# Patient Record
Sex: Female | Born: 1970 | Race: White | Hispanic: No | Marital: Married | Smoking: Never smoker
Health system: Southern US, Community
[De-identification: ages and names within clinical notes are randomized; demographics above are authoritative.]

## PROBLEM LIST (undated history)

## (undated) DIAGNOSIS — I10 Essential (primary) hypertension: Secondary | ICD-10-CM

## (undated) HISTORY — PX: CHOLECYSTECTOMY: SHX55

## (undated) HISTORY — PX: TONSILLECTOMY: SUR1361

---

## 2010-09-22 DIAGNOSIS — M352 Behcet's disease: Secondary | ICD-10-CM | POA: Insufficient documentation

## 2015-06-24 ENCOUNTER — Ambulatory Visit: Payer: Self-pay | Admitting: Physician Assistant

## 2015-06-24 ENCOUNTER — Encounter: Payer: Self-pay | Admitting: Physician Assistant

## 2015-06-24 VITALS — BP 130/70 | HR 107 | Temp 98.4°F

## 2015-06-24 DIAGNOSIS — M62838 Other muscle spasm: Secondary | ICD-10-CM

## 2015-06-24 MED ORDER — METHYLPREDNISOLONE 4 MG PO TBPK: ORAL_TABLET | ORAL | Status: DC

## 2015-06-24 MED ORDER — CYCLOBENZAPRINE HCL 10 MG PO TABS: 10.0000 mg | ORAL_TABLET | Freq: Three times a day (TID) | ORAL | Status: DC | PRN

## 2015-06-24 NOTE — Progress Notes (Signed)
S:  C/o low back pain for 1 day, woke up Sunday with a "crick" in her neck, couldn't turn her head from side to side;  no known injury, pain is worse with movement, low back pain increased with bending over, denies numbness, tingling, or changes in bowel/urinary habits,  Using otc meds without relief, went to massage person at mall and had 10min neck massage, told her she needed to come back Remainder ros neg  O:  Vitals wnl, nad, lungs c t a, cv rrr, spine nontender, muscles in  Left shoulder and left lower back spasmed , decreased rom with bending forward, able to twist,  Neg slr, pt walks without difficulty, no foot drop noted, n/v intact  A: acute back pain, muscle spasms  P: use wet heat followed by ice, stretches, return to clinic if not better in 3 t 5 days, return earlier if worsening, rx meds: medrol dose pack, flexeril

## 2016-03-12 DIAGNOSIS — R7303 Prediabetes: Secondary | ICD-10-CM | POA: Insufficient documentation

## 2016-09-14 ENCOUNTER — Emergency Department: Payer: Managed Care, Other (non HMO)

## 2016-09-14 ENCOUNTER — Encounter: Payer: Self-pay | Admitting: Emergency Medicine

## 2016-09-14 ENCOUNTER — Inpatient Hospital Stay
Admission: EM | Admit: 2016-09-14 | Discharge: 2016-09-16 | DRG: 271 | Disposition: A | Discharge status: Home / Self Care | Payer: Managed Care, Other (non HMO) | Attending: Internal Medicine | Admitting: Internal Medicine

## 2016-09-14 DIAGNOSIS — R55 Syncope and collapse: Secondary | ICD-10-CM | POA: Diagnosis present

## 2016-09-14 DIAGNOSIS — I82412 Acute embolism and thrombosis of left femoral vein: Principal | ICD-10-CM | POA: Diagnosis present

## 2016-09-14 DIAGNOSIS — I82422 Acute embolism and thrombosis of left iliac vein: Secondary | ICD-10-CM | POA: Diagnosis present

## 2016-09-14 DIAGNOSIS — R634 Abnormal weight loss: Secondary | ICD-10-CM | POA: Diagnosis present

## 2016-09-14 DIAGNOSIS — R7303 Prediabetes: Secondary | ICD-10-CM | POA: Diagnosis present

## 2016-09-14 DIAGNOSIS — I10 Essential (primary) hypertension: Secondary | ICD-10-CM | POA: Diagnosis present

## 2016-09-14 DIAGNOSIS — I871 Compression of vein: Secondary | ICD-10-CM | POA: Diagnosis present

## 2016-09-14 DIAGNOSIS — I82402 Acute embolism and thrombosis of unspecified deep veins of left lower extremity: Secondary | ICD-10-CM | POA: Diagnosis not present

## 2016-09-14 DIAGNOSIS — R600 Localized edema: Secondary | ICD-10-CM | POA: Diagnosis not present

## 2016-09-14 DIAGNOSIS — Z79899 Other long term (current) drug therapy: Secondary | ICD-10-CM | POA: Diagnosis not present

## 2016-09-14 DIAGNOSIS — I82409 Acute embolism and thrombosis of unspecified deep veins of unspecified lower extremity: Secondary | ICD-10-CM | POA: Diagnosis present

## 2016-09-14 HISTORY — DX: Essential (primary) hypertension: I10

## 2016-09-14 LAB — URINALYSIS, COMPLETE (UACMP) WITH MICROSCOPIC
BACTERIA UA: NONE SEEN
Bilirubin Urine: NEGATIVE
Glucose, UA: >=500 mg/dL — AB
HGB URINE DIPSTICK: NEGATIVE
Ketones, ur: 5 mg/dL — AB
Leukocytes, UA: NEGATIVE
NITRITE: NEGATIVE
PROTEIN: 30 mg/dL — AB
Specific Gravity, Urine: 1.029 (ref 1.005–1.030)
pH: 5 (ref 5.0–8.0)

## 2016-09-14 LAB — HEPARIN LEVEL (UNFRACTIONATED): Heparin Unfractionated: 0.53 IU/mL (ref 0.30–0.70)

## 2016-09-14 LAB — BASIC METABOLIC PANEL
ANION GAP: 6 (ref 5–15)
BUN: 12 mg/dL (ref 6–20)
CHLORIDE: 102 mmol/L (ref 101–111)
CO2: 24 mmol/L (ref 22–32)
Calcium: 8.8 mg/dL — ABNORMAL LOW (ref 8.9–10.3)
Creatinine, Ser: 0.8 mg/dL (ref 0.44–1.00)
GFR calc Af Amer: >60 mL/min (ref >60)
GFR calc non Af Amer: >60 mL/min (ref >60)
GLUCOSE: 184 mg/dL — AB (ref 65–99)
POTASSIUM: 4 mmol/L (ref 3.5–5.1)
Sodium: 132 mmol/L — ABNORMAL LOW (ref 135–145)

## 2016-09-14 LAB — CBC
HEMATOCRIT: 37 % (ref 35.0–47.0)
HEMOGLOBIN: 12.8 g/dL (ref 12.0–16.0)
MCH: 32.3 pg (ref 26.0–34.0)
MCHC: 34.6 g/dL (ref 32.0–36.0)
MCV: 93.5 fL (ref 80.0–100.0)
Platelets: 349 10*3/uL (ref 150–440)
RBC: 3.95 MIL/uL (ref 3.80–5.20)
RDW: 12.5 % (ref 11.5–14.5)
WBC: 8.9 10*3/uL (ref 3.6–11.0)

## 2016-09-14 LAB — GLUCOSE, CAPILLARY: Glucose-Capillary: 172 mg/dL — ABNORMAL HIGH (ref 65–99)

## 2016-09-14 LAB — PROTIME-INR
INR: 1.05
PROTHROMBIN TIME: 13.6 s (ref 11.4–15.2)

## 2016-09-14 LAB — APTT: APTT: 26 s (ref 24–36)

## 2016-09-14 MED ORDER — HEPARIN BOLUS VIA INFUSION
4700.0000 [IU] | Freq: Once | INTRAVENOUS | Status: AC
  Administered 2016-09-14: 4700 [IU] via INTRAVENOUS
  Filled 2016-09-14: qty 4700

## 2016-09-14 MED ORDER — SODIUM CHLORIDE 0.9 % IV SOLN
INTRAVENOUS | Status: DC
  Administered 2016-09-14 – 2016-09-16 (×3): via INTRAVENOUS

## 2016-09-14 MED ORDER — ONDANSETRON HCL 4 MG/2ML IJ SOLN
4.0000 mg | Freq: Four times a day (QID) | INTRAMUSCULAR | Status: DC | PRN
  Administered 2016-09-15: 4 mg via INTRAVENOUS

## 2016-09-14 MED ORDER — HEPARIN (PORCINE) IN NACL 100-0.45 UNIT/ML-% IJ SOLN
1150.0000 [IU]/h | INTRAMUSCULAR | Status: DC
  Administered 2016-09-14 – 2016-09-15 (×2): 1150 [IU]/h via INTRAVENOUS
  Filled 2016-09-14 (×2): qty 250

## 2016-09-14 MED ORDER — ACETAMINOPHEN 325 MG PO TABS
650.0000 mg | ORAL_TABLET | Freq: Four times a day (QID) | ORAL | Status: DC | PRN
  Administered 2016-09-15 – 2016-09-16 (×2): 650 mg via ORAL
  Filled 2016-09-14 (×2): qty 2

## 2016-09-14 MED ORDER — ACETAMINOPHEN 650 MG RE SUPP
650.0000 mg | Freq: Four times a day (QID) | RECTAL | Status: DC | PRN
  Filled 2016-09-14: qty 1

## 2016-09-14 NOTE — ED Notes (Signed)
Admitting MD at bedside.

## 2016-09-14 NOTE — ED Provider Notes (Signed)
[facility] Emergency Department Provider Note  ____________________________________________  Time seen: Approximately 3:48 PM  I have reviewed the triage vital signs and the nursing notes.   HISTORY  Chief Complaint Loss of Consciousness    HPI Stacy Juarez is a 46 y.o. 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 She sat down and her bossesoffice where she then passed out for a few minutes. When she lay on the floor and people lifted her legs up did feel better.No fevers chills sweats chest pain shortness of breath belly pain or back pain. No headache numbness tingling weakness or vision changes. She reports that she feels essentially fine.   She has had bilateral hip pain for the past 2 days which started after she went running over the weekend both Saturday and Sunday. No trauma.also reports the left leg seems more swollen and painful.     History reviewed. No pertinent past medical history. pRe-diabetes Hypertension  There are no active problems to display for this patient.    Past Surgical History:  Procedure Laterality Date  . CHOLECYSTECTOMY    . TONSILLECTOMY       Prior to Admission medications   Medication Sig Start Date End Date Taking? Authorizing Provider  cyclobenzaprine (FLEXERIL) 10 MG tablet Take 1 tablet (10 mg total) by mouth 3 (three) times daily as needed for muscle spasms. 06/24/15   Fisher, Roselyn Bering, PA-C  losartan (COZAAR) 25 MG tablet Take 25 mg by mouth daily.    [provider]  methylPREDNISolone (MEDROL DOSEPAK) 4 MG TBPK tablet Take 6 pills on day one then decrease by 1 pill each day 06/24/15   Faythe Ghee, PA-C     Allergies Patient has no known allergies.   No family history on file.  Social History Social History  Substance Use Topics  . Smoking status: Never Smoker  . Smokeless  tobacco: Never Used  . Alcohol use Not on file    Review of Systems  Constitutional:   No fever or chills.  ENT:   No sore throat. No rhinorrhea. Cardiovascular:   No chest pain or syncope. Respiratory:   No dyspnea or cough. Gastrointestinal:   Negative for abdominal pain, vomiting and diarrhea.  Musculoskeletal:  Positive left leg pain and swelling All other systems reviewed and are negative except as documented above in ROS and HPI.  ____________________________________________   PHYSICAL EXAM:  VITAL SIGNS: ED Triage Vitals  Enc Vitals Group     BP 09/14/16 1126 (!) 118/53     Pulse Rate 09/14/16 1126 88     Resp 09/14/16 1126 16     Temp 09/14/16 1126 98.2 F (36.8 C)     Temp Source 09/14/16 1126 Oral     SpO2 09/14/16 1126 100 %     Weight 09/14/16 1127 151 lb (68.5 kg)     Height 09/14/16 1127 5\' 5"  (1.651 m)     Head Circumference --      Peak Flow --      Pain Score --      Pain Loc --      Pain Edu? --      Excl. in GC? --     Vital signs reviewed, nursing assessments reviewed.   Constitutional:   Alert and oriented. Well appearing and in no distress. Eyes:   No scleral icterus.  EOMI.  No nystagmus. No conjunctival pallor. PERRL. ENT   Head:   Normocephalic and atraumatic.   Nose:   No congestion/rhinnorhea.    Mouth/Throat:   MMM, no pharyngeal erythema. No peritonsillar mass.    Neck:   No meningismus. Full ROM Hematological/Lymphatic/Immunilogical:   No cervical lymphadenopathy. Cardiovascular:   RRR. Symmetric bilateral radial and DP pulses.  No murmurs.  Respiratory:   Normal respiratory effort without tachypnea/retractions. Breath sounds are clear and equal bilaterally. No wheezes/rales/rhonchi. Gastrointestinal:   Soft and nontender. Non distended. There is no CVA tenderness.  No rebound, rigidity, or guarding. Genitourinary:   deferred Musculoskeletal:   Normal range of motion in all extremities. No joint effusions.  Diffuse mild  tenderness in the left lower extremity which has pronounced swelling compared to the right. Enlarged calf Circumference.no palpable cord Neurologic:   Normal speech and language.  Motor grossly intact. No gross focal neurologic deficits are appreciated.  Skin:    Skin is warm, dry and intact.Faint erythema of the left lower leg..  ____________________________________________    LABS (pertinent positives/negatives) (all labs ordered are listed, but only abnormal results are displayed) Labs Reviewed  GLUCOSE, CAPILLARY - Abnormal; Notable for the following:       Result Value   Glucose-Capillary 172 (*)    All other components within normal limits  BASIC METABOLIC PANEL - Abnormal; Notable for the following:    Sodium 132 (*)    Glucose, Bld 184 (*)    Calcium 8.8 (*)    All other components within normal limits  URINALYSIS, COMPLETE (UACMP) WITH MICROSCOPIC - Abnormal; Notable for the following:    Color, Urine YELLOW (*)    APPearance HAZY (*)    Glucose, UA >=500 (*)    Ketones, ur 5 (*)    Protein, ur 30 (*)    Squamous Epithelial / LPF 0-5 (*)    All other components within normal limits  CBC  CBG MONITORING, ED   ____________________________________________   EKG  Interpreted by me  Date: 09/14/2016  Rate: 90  Rhythm: normal sinus rhythm  QRS Axis: normal  Intervals: normal  ST/T Wave abnormalities: normal  Conduction Disutrbances: none  Narrative Interpretation: unremarkable      ____________________________________________    RADIOLOGY  US Venous Img Lower Unilateral Left  Result Date: 09/14/2016 CLINICAL DATA:  Left leg pain and swelling. Patient on birth control. EXAM: LEFT LOWER EXTREMITY VENOUS DOPPLER ULTRASOUND TECHNIQUE: Gray-scale sonography with graded compression, as well as color Doppler and duplex ultrasound were performed to evaluate the lower extremity deep venous systems from the level of the common femoral vein and including the common  femoral, femoral, profunda femoral, popliteal and calf veins including the posterior tibial, peroneal and gastrocnemius veins when visible. The superficial great saphenous vein was also interrogated. Spectral Doppler was utilized to evaluate flow at rest and with distal augmentation maneuvers in the common femoral, femoral and popliteal veins. COMPARISON:  None. FINDINGS: Contralateral Common Femoral Vein: Respiratory phasicity is normal and symmetric with the symptomatic side. No evidence of thrombus. Normal compressibility. Common Femoral Vein: Occlusive thrombosis of the CFV. No compressibility or venous waveforms are demonstrated. Augmentation not performed. Saphenofemoral Junction: Nonocclusive thrombosis is noted with minimal color doppler flow noted within. Profunda Femoral Vein: Nonocclusive thrombosis is noted with some flow demonstrated . Femoral Vein: Nonocclusive thrombosis is noted in the mid to distal femoral vein with some color Doppler and venous waveforms detected in the proximal and distal femoral vein. Popliteal Vein: Nonocclusive  thrombosis with some flow demonstrated in the proximal as well as distal popliteal veins. Calf Veins: No evidence of thrombus. Normal compressibility and flow on color Doppler imaging. Superficial Great Saphenous Vein: No evidence of thrombus. Normal compressibility and flow on color Doppler imaging. Venous Reflux:  None. Other Findings: The left external iliac vein was difficult to assess and extension of thrombus cannot be entirely excluded within. IMPRESSION: 1. Occlusive DVT of the left common femoral vein with nonocclusive thrombus from GSV junction through popliteal vein. These results will be called to the ordering clinician or representative by the Radiologist Assistant, and communication documented in the PACS or zVision Dashboard. 2. There may also be thrombosis of the left external iliac vein but this is not well characterized given its location  sonographically. Electronically Signed   By: Tollie Eth M.D.   On: 09/14/2016 15:33    ____________________________________________   PROCEDURES Procedures  ____________________________________________   INITIAL IMPRESSION / ASSESSMENT AND PLAN / ED COURSE  Pertinent labs & imaging results that were available during my care of the patient were reviewed by me and considered in my medical decision making (see chart for details).    Clinical Course as of Sep 14 1633  Tue Sep 14, 2016  1424 P/w likely vagal syncope, but has exam concerning for lle dvt. Will get [place]. Otherwise well appearing, nad. Recent a1c 6.0.  [PS]  1545 [place] positive for proximal DVT. Will d/w vascular  [PS]  1600 [place] results and sx d/w Dr. Wyn Quaker, who recommends admission, anticoagulation, and would plan for thrombectomy/lysis tomorrow.   [PS]    Clinical Course User Index [PS] Sharman Cheek, MD     ----------------------------------------- 4:35 PM on 09/14/2016 -----------------------------------------  Discussed with hospitalist. Care plan discussed with patient who agrees. Start heparin. No signs or symptoms of pulmonary embolism at this time.  ____________________________________________   FINAL CLINICAL IMPRESSION(S) / ED DIAGNOSES  Final diagnoses:  Acute deep vein thrombosis (DVT) of femoral vein of left lower extremity (HCC)  Vasovagal syncope      New Prescriptions   No medications on file     Portions of this note were generated with dragon dictation software. Dictation errors may occur despite best attempts at proofreading.    Sharman Cheek, MD 09/14/16 [id number]

## 2016-09-14 NOTE — Consult Note (Signed)
ANTICOAGULATION CONSULT NOTE - Initial Consult  Pharmacy Consult for heparin drip Indication: DVT  No Known Allergies  Patient Measurements: Height: 5\' 5"  (165.1 cm) Weight: 151 lb (68.5 kg) IBW/kg (Calculated) : 57 Heparin Dosing Weight: 68.5kg  Vital Signs: Temp: 98.2 F (36.8 C) (08/28 1126) Temp Source: Oral (08/28 1126) BP: 109/56 (08/28 1638) Pulse Rate: 87 (08/28 1638)  Labs:  Recent Labs  09/14/16 1111  HGB 12.8  HCT 37.0  PLT 349  CREATININE 0.80    Estimated Creatinine Clearance: 84.5 mL/min (by C-G formula based on SCr of 0.8 mg/dL).   Medical History: History reviewed. No pertinent past medical history.  Medications:  Scheduled:  . heparin  4,700 Units Intravenous Once    Assessment: Pt is a 46 year old female who presents with dizziness, syncope but also complains on swelling and pain in leg. Found to have an occulsive DVT of left common femoral vein w/ nonocclusive thrombus of GSV w/ possible thrombus if left external iliac vein. Pt is not on any home anticoagulation as seen on med rec/ care everywhere. Baseline CBC WNL. Add on INR and APTT ordered. Vascular to see for possible thrombectomy/lysis tomorrow.  Goal of Therapy:  Heparin level 0.3-0.7 units/ml Monitor platelets by anticoagulation protocol: Yes   Plan:  Give 4700 units bolus x 1 Start heparin infusion at 1150 units/hr Check anti-Xa level in 6 hours and daily while on heparin Continue to monitor H&H and platelets  Jovonni Borquez D Keeyon Privitera, Pharm.D, BCPS Clinical Pharmacist  09/14/2016,4:41 PM

## 2016-09-14 NOTE — ED Triage Notes (Signed)
C/O syncope.  Patient states while at work, felt hot and clammy.  Went into bosses office and sat down on chair, stated didn't feel well.  Patient became unresponsive while sitting in chair.  Here for evaluation.  Also c/o right leg numbness this morning and now right leg "soreness".

## 2016-09-14 NOTE — ED Notes (Signed)
Pt to bathroom . Will start IV when returns.

## 2016-09-14 NOTE — H&P (Signed)
[facility] [place] [facility]   PATIENT NAME: Stacy Juarez    MR#:  [id number]  DATE OF BIRTH:  12-Jul-1970  DATE OF ADMISSION:  09/14/2016  PRIMARY CARE PHYSICIAN: Drue Stager, MD   REQUESTING/REFERRING PHYSICIAN: Dr Herma Carson  CHIEF COMPLAINT:   Chief Complaint  Patient presents with  . Loss of Consciousness    HISTORY OF PRESENT ILLNESS:  Stacy Juarez  is a 46 y.o. female presents after a passout episode. She complains of some numbness in her left leg. She stated her muscles were tight in her upper thigh going down. She got hot and clammy and nauseous. She had a questionable passout episode where she was not responding quickly. She was at work at the time and was sent into the ER. When her legs were placed up in the air and she was lied back down she felt a lot better. She's been having a lot of stress with her mother and she's not been sleeping well lately. She's had some weight loss over the past week and been tired over the past week. She does take birth control pills. She was found to have a DVT in the left lower extremity.  PAST MEDICAL HISTORY:   Past Medical History:  Diagnosis Date  . Hypertension     PAST SURGICAL HISTORY:   Past Surgical History:  Procedure Laterality Date  . CHOLECYSTECTOMY    . TONSILLECTOMY      SOCIAL HISTORY:   Social History  Substance Use Topics  . Smoking status: Never Smoker  . Smokeless tobacco: Never Used  . Alcohol use Not on file    FAMILY HISTORY:   Family History  Problem Relation Age of Onset  . CAD Mother   . Lymphoma Father     DRUG ALLERGIES:  No Known Allergies  REVIEW OF SYSTEMS:  CONSTITUTIONAL: No fever, positive for weight loss. Positive for fatigue.  EYES: No blurred or double vision.  EARS, NOSE, AND THROAT: No tinnitus or ear pain. No sore throat RESPIRATORY: No cough, shortness of breath, wheezing or hemoptysis.  CARDIOVASCULAR: No chest pain,  orthopnea, edema.  GASTROINTESTINAL: positive for nausea. novomiting, diarrhea or abdominal pain. No blood in bowel movements GENITOURINARY: No dysuria, hematuria.  ENDOCRINE: No polyuria, nocturia,  HEMATOLOGY: No anemia, easy bruising or bleeding SKIN: No rash or lesion. MUSCULOSKELETAL: No joint pain or arthritis.  Tightness in the left thigh NEUROLOGIC: No tingling, numbness, weakness. Possible syncope today PSYCHIATRY: No anxiety or depression.   MEDICATIONS AT HOME:   Prior to Admission medications   Medication Sig Start Date End Date Taking? Authorizing Provider  KRILL OIL PO Take 1 capsule by mouth daily.   Yes [provider]  levonorgestrel-ethinyl estradiol (NORDETTE) 0.15-30 MG-MCG tablet Take 1 tablet by mouth daily.   Yes [provider]  losartan (COZAAR) 25 MG tablet Take 25 mg by mouth daily.   Yes [provider]      VITAL SIGNS:  Blood pressure (!) 109/56, pulse 87, temperature 98.2 F (36.8 C), temperature source Oral, resp. rate 18, height 5\' 5"  (1.651 m), weight 68.5 kg (151 lb), last menstrual period 07/15/2016, SpO2 100 %.  PHYSICAL EXAMINATION:  GENERAL:  46 y.o.-year-old patient lying in the bed with no acute distress.  EYES: Pupils equal, round, reactive to light and accommodation. No scleral icterus. Extraocular muscles intact.  HEENT: Head atraumatic, normocephalic. Oropharynx and nasopharynx clear.  NECK:  Supple, no jugular venous distention. No thyroid enlargement, no  tenderness.  LUNGS: Normal breath sounds bilaterally, no wheezing, rales,rhonchi or crepitation. No use of accessory muscles of respiration.  CARDIOVASCULAR: S1, S2 normal. No murmurs, rubs, or gallops.  ABDOMEN: Soft, nontender, nondistended. Bowel sounds present. No organomegaly or mass.  EXTREMITIES: No pedal edema, cyanosis, or clubbing.  NEUROLOGIC: Cranial nerves II through XII are intact. Muscle strength 5/5 in all extremities. Sensation intact. Gait not  checked.  PSYCHIATRIC: The patient is alert and oriented x 3.  SKIN: No rash, lesion, or ulcer.   LABORATORY PANEL:   CBC  Recent Labs Lab 09/14/16 1111  WBC 8.9  HGB 12.8  HCT 37.0  PLT 349   ------------------------------------------------------------------------------------------------------------------  Chemistries   Recent Labs Lab 09/14/16 1111  NA 132*  K 4.0  CL 102  CO2 24  GLUCOSE 184*  BUN 12  CREATININE 0.80  CALCIUM 8.8*   ------------------------------------------------------------------------------------------------------------------   RADIOLOGY:  US Venous Img Lower Unilateral Left  Result Date: 09/14/2016 CLINICAL DATA:  Left leg pain and swelling. Patient on birth control. EXAM: LEFT LOWER EXTREMITY VENOUS DOPPLER ULTRASOUND TECHNIQUE: Gray-scale sonography with graded compression, as well as color Doppler and duplex ultrasound were performed to evaluate the lower extremity deep venous systems from the level of the common femoral vein and including the common femoral, femoral, profunda femoral, popliteal and calf veins including the posterior tibial, peroneal and gastrocnemius veins when visible. The superficial great saphenous vein was also interrogated. Spectral Doppler was utilized to evaluate flow at rest and with distal augmentation maneuvers in the common femoral, femoral and popliteal veins. COMPARISON:  None. FINDINGS: Contralateral Common Femoral Vein: Respiratory phasicity is normal and symmetric with the symptomatic side. No evidence of thrombus. Normal compressibility. Common Femoral Vein: Occlusive thrombosis of the CFV. No compressibility or venous waveforms are demonstrated. Augmentation not performed. Saphenofemoral Junction: Nonocclusive thrombosis is noted with minimal color doppler flow noted within. Profunda Femoral Vein: Nonocclusive thrombosis is noted with some flow demonstrated . Femoral Vein: Nonocclusive thrombosis is noted in the mid  to distal femoral vein with some color Doppler and venous waveforms detected in the proximal and distal femoral vein. Popliteal Vein: Nonocclusive thrombosis with some flow demonstrated in the proximal as well as distal popliteal veins. Calf Veins: No evidence of thrombus. Normal compressibility and flow on color Doppler imaging. Superficial Great Saphenous Vein: No evidence of thrombus. Normal compressibility and flow on color Doppler imaging. Venous Reflux:  None. Other Findings: The left external iliac vein was difficult to assess and extension of thrombus cannot be entirely excluded within. IMPRESSION: 1. Occlusive DVT of the left common femoral vein with nonocclusive thrombus from GSV junction through popliteal vein. These results will be called to the ordering clinician or representative by the Radiologist Assistant, and communication documented in the PACS or zVision Dashboard. 2. There may also be thrombosis of the left external iliac vein but this is not well characterized given its location sonographically. Electronically Signed   By: Tollie Eth M.D.   On: 09/14/2016 15:33    EKG:   Normal sinus rhythm 90 bpm  IMPRESSION AND PLAN:   1. Occlusive DVT left lower extremity. ER physician spoke with Dr. Wyn Quaker vascular surgery to do a thrombectomy and lysis tomorrow. Patient started on IV heparin by ER physician and I will continue this. Send off a hypercoagulable workup. Birth control pills is the likely etiology. I advised her to stop these birth control pills. 2. Vasovagal syncope. Give IV fluid hydration and hold losartan at this point.monitor overnight  on telemetry and get an echocardiogram. 3. Essential hypertension. Blood pressure on the lower side hold losartan at this point.  All the records are reviewed and case discussed with ED provider. Management plans discussed with the patient, family and they are in agreement.  CODE STATUS: full code  TOTAL TIME TAKING CARE OF THIS PATIENT: 50  minutes.    Alford Highland M.D on 09/14/2016 at 5:32 PM  Between 7am to 6pm - Pager - [phone]  After 6pm call admission pager [phone]  [facility] Office  [phone]  CC: Primary care physician; Drue Stager, MD

## 2016-09-14 NOTE — Progress Notes (Addendum)
ANTICOAGULATION CONSULT NOTE - Follow Up Consult  Pharmacy Consult for Heparin  Indication: DVT  No Known Allergies  Patient Measurements: Height: 5\' 5"  (165.1 cm) Weight: 151 lb (68.5 kg) IBW/kg (Calculated) : 57 Heparin Dosing Weight: 68.5 kg   Vital Signs: Temp: 98.3 F (36.8 C) (08/28 2123) Temp Source: Oral (08/28 2123) BP: 119/56 (08/28 2123) Pulse Rate: 79 (08/28 2123)  Labs:  Recent Labs  09/14/16 1111 09/14/16 1633 09/14/16 2257  HGB 12.8  --   --   HCT 37.0  --   --   PLT 349  --   --   APTT  --  26  --   LABPROT  --  13.6  --   INR  --  1.05  --   HEPARINUNFRC  --   --  0.53  CREATININE 0.80  --   --     Estimated Creatinine Clearance: 84.5 mL/min (by C-G formula based on SCr of 0.8 mg/dL).   Medications:  Scheduled:    Assessment: Pharmacy consulted to dose heparin in this 46 year old female with DVT.   Goal of Therapy:  Heparin level 0.3-0.7 units/ml Monitor platelets by anticoagulation protocol: Yes   Plan:  8/28:  HL @ 23:00 = 0.53.   Will continue this pt on current rate of 1150 units/hr and recheck HL in 6 hrs.   Rhoda Waldvogel D 09/14/2016,11:55 PM

## 2016-09-15 ENCOUNTER — Encounter
Admission: EM | Disposition: A | Discharge status: Home / Self Care | Payer: Self-pay | Source: Home / Self Care | Attending: Internal Medicine

## 2016-09-15 ENCOUNTER — Inpatient Hospital Stay
Admit: 2016-09-15 | Discharge: 2016-09-15 | Disposition: A | Discharge status: Home / Self Care | Payer: Managed Care, Other (non HMO) | Attending: Internal Medicine | Admitting: Internal Medicine

## 2016-09-15 ENCOUNTER — Inpatient Hospital Stay: Payer: Managed Care, Other (non HMO)

## 2016-09-15 DIAGNOSIS — I82402 Acute embolism and thrombosis of unspecified deep veins of left lower extremity: Secondary | ICD-10-CM

## 2016-09-15 DIAGNOSIS — R600 Localized edema: Secondary | ICD-10-CM

## 2016-09-15 DIAGNOSIS — I10 Essential (primary) hypertension: Secondary | ICD-10-CM

## 2016-09-15 HISTORY — PX: PERIPHERAL VASCULAR THROMBECTOMY: CATH118306

## 2016-09-15 LAB — CBC
HCT: 34.8 % — ABNORMAL LOW (ref 35.0–47.0)
HEMOGLOBIN: 11.9 g/dL — AB (ref 12.0–16.0)
MCH: 32 pg (ref 26.0–34.0)
MCHC: 34.1 g/dL (ref 32.0–36.0)
MCV: 93.8 fL (ref 80.0–100.0)
PLATELETS: 275 10*3/uL (ref 150–440)
RBC: 3.71 MIL/uL — AB (ref 3.80–5.20)
RDW: 12.4 % (ref 11.5–14.5)
WBC: 8.4 10*3/uL (ref 3.6–11.0)

## 2016-09-15 LAB — BASIC METABOLIC PANEL
ANION GAP: 4 — AB (ref 5–15)
BUN: 10 mg/dL (ref 6–20)
CHLORIDE: 108 mmol/L (ref 101–111)
CO2: 25 mmol/L (ref 22–32)
CREATININE: 0.77 mg/dL (ref 0.44–1.00)
Calcium: 8.4 mg/dL — ABNORMAL LOW (ref 8.9–10.3)
GFR calc non Af Amer: >60 mL/min (ref >60)
Glucose, Bld: 111 mg/dL — ABNORMAL HIGH (ref 65–99)
POTASSIUM: 3.9 mmol/L (ref 3.5–5.1)
SODIUM: 137 mmol/L (ref 135–145)

## 2016-09-15 LAB — HEPARIN LEVEL (UNFRACTIONATED): HEPARIN UNFRACTIONATED: 0.36 [IU]/mL (ref 0.30–0.70)

## 2016-09-15 LAB — PREGNANCY, URINE: Preg Test, Ur: NEGATIVE

## 2016-09-15 LAB — ANTITHROMBIN III: AntiThromb III Func: 88 % (ref 75–120)

## 2016-09-15 SURGERY — PERIPHERAL VASCULAR THROMBECTOMY
Anesthesia: Moderate Sedation | Laterality: Left

## 2016-09-15 MED ORDER — LIDOCAINE-EPINEPHRINE (PF) 2 %-1:200000 IJ SOLN
INTRAMUSCULAR | Status: AC
  Filled 2016-09-15: qty 20

## 2016-09-15 MED ORDER — CEFAZOLIN SODIUM-DEXTROSE 1-4 GM/50ML-% IV SOLN
1.0000 g | Freq: Once | INTRAVENOUS | Status: AC
  Administered 2016-09-15: 1 g via INTRAVENOUS

## 2016-09-15 MED ORDER — FENTANYL CITRATE (PF) 100 MCG/2ML IJ SOLN
INTRAMUSCULAR | Status: AC
  Filled 2016-09-15: qty 2

## 2016-09-15 MED ORDER — DEXTROSE 5 % IV SOLN
1.5000 g | INTRAVENOUS | Status: DC
  Filled 2016-09-15: qty 1.5

## 2016-09-15 MED ORDER — IOPAMIDOL (ISOVUE-300) INJECTION 61%
INTRAVENOUS | Status: DC | PRN
  Administered 2016-09-15: 55 mL via INTRAVENOUS

## 2016-09-15 MED ORDER — HEPARIN (PORCINE) IN NACL 2-0.9 UNIT/ML-% IJ SOLN
INTRAMUSCULAR | Status: AC
  Filled 2016-09-15: qty 1000

## 2016-09-15 MED ORDER — ONDANSETRON HCL 4 MG/2ML IJ SOLN
INTRAMUSCULAR | Status: AC
  Administered 2016-09-15: 4 mg
  Filled 2016-09-15: qty 2

## 2016-09-15 MED ORDER — ONDANSETRON HCL 4 MG/2ML IJ SOLN: 4.0000 mg | Freq: Once | INTRAMUSCULAR | Status: DC

## 2016-09-15 MED ORDER — SODIUM CHLORIDE 0.9 % IV SOLN: INTRAVENOUS | Status: DC

## 2016-09-15 MED ORDER — HEPARIN SODIUM (PORCINE) 1000 UNIT/ML IJ SOLN
INTRAMUSCULAR | Status: DC | PRN
  Administered 2016-09-15: 4000 [IU] via INTRAVENOUS

## 2016-09-15 MED ORDER — MIDAZOLAM HCL 5 MG/5ML IJ SOLN
INTRAMUSCULAR | Status: AC
  Filled 2016-09-15: qty 5

## 2016-09-15 MED ORDER — IOPAMIDOL (ISOVUE-370) INJECTION 76%
75.0000 mL | Freq: Once | INTRAVENOUS | Status: AC | PRN
  Administered 2016-09-15: 08:00:00 75 mL via INTRAVENOUS

## 2016-09-15 MED ORDER — ALTEPLASE 2 MG IJ SOLR
INTRAMUSCULAR | Status: DC | PRN
  Administered 2016-09-15: 10 mg

## 2016-09-15 MED ORDER — HEPARIN SODIUM (PORCINE) 1000 UNIT/ML IJ SOLN
INTRAMUSCULAR | Status: AC
  Filled 2016-09-15: qty 1

## 2016-09-15 MED ORDER — MIDAZOLAM HCL 2 MG/2ML IJ SOLN
INTRAMUSCULAR | Status: AC
  Filled 2016-09-15: qty 2

## 2016-09-15 MED ORDER — ALTEPLASE 2 MG IJ SOLR
INTRAMUSCULAR | Status: AC
  Filled 2016-09-15: qty 10

## 2016-09-15 MED ORDER — MIDAZOLAM HCL 2 MG/2ML IJ SOLN
INTRAMUSCULAR | Status: DC | PRN
Start: 2016-09-15 — End: 2016-09-15
  Administered 2016-09-15 (×5): 1 mg via INTRAVENOUS
  Administered 2016-09-15: 2 mg via INTRAVENOUS

## 2016-09-15 MED ORDER — OXYCODONE HCL 5 MG PO TABS
5.0000 mg | ORAL_TABLET | ORAL | Status: DC | PRN
  Administered 2016-09-15: 21:00:00 5 mg via ORAL
  Filled 2016-09-15: qty 1

## 2016-09-15 MED ORDER — FENTANYL CITRATE (PF) 100 MCG/2ML IJ SOLN
INTRAMUSCULAR | Status: DC | PRN
  Administered 2016-09-15: 25 ug via INTRAVENOUS
  Administered 2016-09-15: 50 ug via INTRAVENOUS
  Administered 2016-09-15: 25 ug via INTRAVENOUS
  Administered 2016-09-15: 50 ug via INTRAVENOUS
  Administered 2016-09-15: 25 ug via INTRAVENOUS
  Administered 2016-09-15 (×2): 50 ug via INTRAVENOUS
  Administered 2016-09-15: 25 ug via INTRAVENOUS

## 2016-09-15 SURGICAL SUPPLY — 18 items
BALLN ARMADA 14X80X80 (BALLOONS) ×3
BALLN ULTRVRSE 12X80X75 (BALLOONS) ×3
BALLOON ARMADA 14X80X80 (BALLOONS) ×1 IMPLANT
BALLOON ULTRVRSE 12X80X75 (BALLOONS) ×1 IMPLANT
CANISTER PENUMBRA MAX (MISCELLANEOUS) ×2 IMPLANT
CANNULA 5F STIFF (CANNULA) ×3 IMPLANT
CATH BEACON 5 .035 65 KMP TIP (CATHETERS) ×3 IMPLANT
CATH INDIGO 8 TORQ TIP 85CM (CATHETERS) ×3 IMPLANT
COVER PROBE U/S 5X48 (MISCELLANEOUS) ×6 IMPLANT
DEVICE PRESTO INFLATION (MISCELLANEOUS) ×3 IMPLANT
DEVICE SAFEGUARD 24CM (GAUZE/BANDAGES/DRESSINGS) ×3 IMPLANT
FILTER VC CELECT-FEMORAL (Filter) ×3 IMPLANT
PACK ANGIOGRAPHY (CUSTOM PROCEDURE TRAY) ×3 IMPLANT
SHEATH BRITE TIP 8FRX11 (SHEATH) ×3 IMPLANT
STENT LIFESTAR 14X80X80 (Permanent Stent) ×3 IMPLANT
TUBING ASPIRATION INDIGO (MISCELLANEOUS) ×3 IMPLANT
WIRE J 3MM .035X145CM (WIRE) ×3 IMPLANT
WIRE MAGIC TORQUE 260C (WIRE) ×3 IMPLANT

## 2016-09-15 NOTE — Progress Notes (Signed)
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 Continue to monitor

## 2016-09-15 NOTE — Op Note (Signed)
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 US guidance for vascular access to right femoral vein and left popliteal vein 2. Catheter placement into IVC from right femoral vein approach and left popliteal vein approach 3. IVC gram and LLE venogram 4. IVC filter placement 5.   Catheter directed thrombolysis with 10 mg of TPA to the left superficial femoral vein, common femoral vein, external iliac vein, and common iliac vein 6. Mechanical thrombectomy to the left superficial femoral vein, common femoral vein, external iliac vein, and common iliac vein with the penumbra cat 8 catheter 7. PTA of left superficial femoral vein and common femoral vein with 12 mm balloon 8. PTA of left external iliac vein and common iliac vein with 12 mm balloon 9.   Self-expanding stent placement to the left external and common iliac veins with 14 mm diameter by 8 cm length stent   SURGEON: Leotis Pain, MD  ASSISTANT(S): none  ANESTHESIA: local with moderate conscious sedation for 60 minutes using 7 mg of Versed and 300 mcg of Fentanyl  ESTIMATED BLOOD LOSS: 300 cc  FINDING(S): 1. extensive left lower extremity DVT throughout the left superficial femoral vein, common femoral vein, external iliac vein, and common iliac vein. The IVC was patent. 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  SPECIMEN(S): none  INDICATIONS:  Patient is a 46 y.o. female who presents with extensive left lower extremity DVT. Patient has marked leg swelling and pain. Venous intervention is performed to reduce the symtpoms and avoid long term postphlebitic symptoms.   DESCRIPTION: After obtaining full informed written consent, the patient was brought back to the vascular suite and placed supine upon the table.Moderate conscious sedation  was administered during a face to face encounter with the patient throughout the procedure with my supervision of the RN administering medicines and monitoring the patient's vital signs, pulse oximetry, telemetry and mental status throughout from the start of the procedure until the patient was taken to the recovery room. After obtaining adequate anesthesia, the patient was prepped and draped in the standard fashion. The right femoral vein was then accessed under US guidance and found to be widely patent. It was accessed without difficulty and a permanent image was recorded. I then placed the delivery sheath into the IVC. The IVC was patent and the renal veins were at the level of L1. The Cook Celect IVC filter was then deployed at the level of L2. The delivery sheath was then removed and dressings were placed in the right groin.  The patient was then placed into the prone position. The left popliteal vein was then accessed under direct ultrasound guidance without difficulty with a micropuncture needle and a permanent image was recorded. I then upsized to an 8Fr sheath over a J wire. 4000 units of heparin were then given. Imaging showed extensive DVT with minimal flow. A Kumpe catheter and Magic torque wire were then advanced into the CFV and images were performed. there was occlusion of the iliac vein system consistent with May Thurner syndrome. I was able to cross the thrombus and stenosis and advance into the IVC which was patent. I then used the Kumpe catheter and instilled 10 mg of tpa throughout the SFV, CFV, and iliac veins.  After this dwelled for 10 minutes, I used the penumbra cat 8 catheter and evacuated about 300 cc  of effluent with mechanical thrombectomy throughout the iliac veins, CFV, and SFV.multiple passes were made and large chunks of clot were removed. This had mild improvement in the common femoral vein and iliac venous system.The SFV was actually significantly improved up to  the most proximal portion near the common femoral vein. I then treated the SFV and CFV with 12 mm diameter angioplasty balloon to open a channel. This resulted in some resolution of the thrombus and improved flow. I then turned my attention to the iliac veins. The stenosis/occlusion and thrombus was treated with 2 inflations with the 12 mm diameter by 8 cm length angioplasty balloon. There was a very tight waist which improved at about 6-8 atm but there was immediate recoil and near occlusive residual stenosis. I elected to treat the residual iliac vein stenosis represented a May Thurner syndrome with a 14 mm diameter by 8 cm length self-expanding stent. This was deployed across the external iliac vein and common iliac vein taking care not to encroach upon the inferior vena cava.This was postdilated with a 14 mm balloon with only about a 20% residual stenosis and improved flow. There was still mild to moderate residual thrombus within the common femoral vein and proximal superficial femoral vein but this was markedly improved. I then elected to terminate the procedure. The sheath was removed and a dressing was placed. She was taken to the recovery room in stable condition having tolerated the procedure well.   COMPLICATIONS: None  CONDITION: Stable  Leotis Pain 09/15/2016 6:25 PM

## 2016-09-15 NOTE — Plan of Care (Signed)
Problem: Education: Goal: Knowledge of [facility] Education information/materials will improve Outcome: Progressing VSS, free of falls during shift.  Denies pain, reports "discomfort" in LLE.  Reported nausea post-ambulation to bathroom/in room.  Dr. Amado CoeGouru paged, PRN IV Zofran 4mg  q6h ordered.  Pt reported nausea resolving, declined IV Zofran.  Refused lab draw earlier in shift d/t concern re: low PO intake and amount of blood needed for draw.  This RN spoke w/ lab, 1700 and 2300 lab draws consolidated, requested fewest number of tubes drawn, pt compliant w/ 2300 lab draw.  No other complaints overnight.  Bed in low position, call bell within reach.  WCTM.

## 2016-09-15 NOTE — Progress Notes (Signed)
[facility] [place] [facility]   PATIENT NAME: Stacy Juarez    MR#:  [id number]  DATE OF BIRTH:  1970-01-30  SUBJECTIVE:  CHIEF COMPLAINT:   Chief Complaint  Patient presents with  . Loss of Consciousness   - on heparin drip for left leg DVT. Going for thrombolysis, thrombectomy and IVC filter placement today. -Still having pain on weight-bearing  REVIEW OF SYSTEMS:  Review of Systems  Constitutional: Negative for chills, fever and malaise/fatigue.  HENT: Negative for congestion, ear discharge, hearing loss and nosebleeds.   Eyes: Negative for blurred vision, double vision and photophobia.  Respiratory: Negative for cough, shortness of breath and wheezing.   Cardiovascular: Positive for leg swelling. Negative for chest pain and palpitations.  Gastrointestinal: Negative for abdominal pain, constipation, diarrhea, nausea and vomiting.  Genitourinary: Negative for dysuria and urgency.  Musculoskeletal: Positive for myalgias.  Neurological: Negative for dizziness, sensory change, speech change, focal weakness, seizures and headaches.  Psychiatric/Behavioral: Negative for depression.    DRUG ALLERGIES:  No Known Allergies  VITALS:  Blood pressure 120/64, pulse 76, temperature 98.3 F (36.8 C), temperature source Oral, resp. rate 16, height 5\' 5"  (1.651 m), weight 68.5 kg (151 lb), last menstrual period 07/15/2016, SpO2 98 %.  PHYSICAL EXAMINATION:  Physical Exam  GENERAL:  46 y.o.-year-old patient lying in the bed with no acute distress.  EYES: Pupils equal, round, reactive to light and accommodation. No scleral icterus. Extraocular muscles intact.  HEENT: Head atraumatic, normocephalic. Oropharynx and nasopharynx clear.  NECK:  Supple, no jugular venous distention. No thyroid enlargement, no tenderness.  LUNGS: Normal breath sounds bilaterally, no wheezing, rales,rhonchi or crepitation. No use of accessory muscles of respiration.  CARDIOVASCULAR:  S1, S2 normal. No murmurs, rubs, or gallops.  ABDOMEN: Soft, nontender, nondistended. Bowel sounds present. No organomegaly or mass.  EXTREMITIES: left leg is swollen, not erythematous but swollen all the way to the thigh. -No pedal edema, cyanosis, or clubbing.  NEUROLOGIC: Cranial nerves II through XII are intact. Muscle strength 5/5 in all extremities. Sensation intact. Gait not checked.  PSYCHIATRIC: The patient is alert and oriented x 3.  SKIN: No obvious rash, lesion, or ulcer.    LABORATORY PANEL:   CBC  Recent Labs Lab 09/15/16 0406  WBC 8.4  HGB 11.9*  HCT 34.8*  PLT 275   ------------------------------------------------------------------------------------------------------------------  Chemistries   Recent Labs Lab 09/15/16 0406  NA 137  K 3.9  CL 108  CO2 25  GLUCOSE 111*  BUN 10  CREATININE 0.77  CALCIUM 8.4*   ------------------------------------------------------------------------------------------------------------------  Cardiac Enzymes No results for input(s): TROPONINI in the last 168 hours. ------------------------------------------------------------------------------------------------------------------  RADIOLOGY:  Ct Angio Chest Pe W Or Wo Contrast  Result Date: 09/15/2016 CLINICAL DATA:  Syncope. Lower extremity DVT. Question pulmonary embolus. EXAM: CT ANGIOGRAPHY CHEST WITH CONTRAST TECHNIQUE: Multidetector CT imaging of the chest was performed using the standard protocol during bolus administration of intravenous contrast. Multiplanar CT image reconstructions and MIPs were obtained to evaluate the vascular anatomy. CONTRAST:  75 cc Isovue 370 IV COMPARISON:  None. FINDINGS: Cardiovascular: No filling defects in the pulmonary arteries to suggest pulmonary emboli. Heart is normal size. Aorta is normal caliber. Mediastinum/Nodes: No mediastinal, hilar, or axillary adenopathy. Trachea and esophagus are unremarkable. Lungs/Pleura: Lungs are clear. No  focal airspace opacities or suspicious nodules. No effusions. Upper Abdomen: Imaging into the upper abdomen shows no acute findings. Musculoskeletal: Chest wall soft tissues are unremarkable. No acute bony abnormality. Review of the  MIP images confirms the above findings. IMPRESSION: No evidence of pulmonary embolus. No acute cardiopulmonary disease Electronically Signed   By: Charlett Nose M.D.   On: 09/15/2016 08:57   US Venous Img Lower Unilateral Left  Result Date: 09/14/2016 CLINICAL DATA:  Left leg pain and swelling. Patient on birth control. EXAM: LEFT LOWER EXTREMITY VENOUS DOPPLER ULTRASOUND TECHNIQUE: Gray-scale sonography with graded compression, as well as color Doppler and duplex ultrasound were performed to evaluate the lower extremity deep venous systems from the level of the common femoral vein and including the common femoral, femoral, profunda femoral, popliteal and calf veins including the posterior tibial, peroneal and gastrocnemius veins when visible. The superficial great saphenous vein was also interrogated. Spectral Doppler was utilized to evaluate flow at rest and with distal augmentation maneuvers in the common femoral, femoral and popliteal veins. COMPARISON:  None. FINDINGS: Contralateral Common Femoral Vein: Respiratory phasicity is normal and symmetric with the symptomatic side. No evidence of thrombus. Normal compressibility. Common Femoral Vein: Occlusive thrombosis of the CFV. No compressibility or venous waveforms are demonstrated. Augmentation not performed. Saphenofemoral Junction: Nonocclusive thrombosis is noted with minimal color doppler flow noted within. Profunda Femoral Vein: Nonocclusive thrombosis is noted with some flow demonstrated . Femoral Vein: Nonocclusive thrombosis is noted in the mid to distal femoral vein with some color Doppler and venous waveforms detected in the proximal and distal femoral vein. Popliteal Vein: Nonocclusive thrombosis with some flow  demonstrated in the proximal as well as distal popliteal veins. Calf Veins: No evidence of thrombus. Normal compressibility and flow on color Doppler imaging. Superficial Great Saphenous Vein: No evidence of thrombus. Normal compressibility and flow on color Doppler imaging. Venous Reflux:  None. Other Findings: The left external iliac vein was difficult to assess and extension of thrombus cannot be entirely excluded within. IMPRESSION: 1. Occlusive DVT of the left common femoral vein with nonocclusive thrombus from GSV junction through popliteal vein. These results will be called to the ordering clinician or representative by the Radiologist Assistant, and communication documented in the PACS or zVision Dashboard. 2. There may also be thrombosis of the left external iliac vein but this is not well characterized given its location sonographically. Electronically Signed   By: Tollie Eth M.D.   On: 09/14/2016 15:33    EKG:   Orders placed or performed during the hospital encounter of 09/14/16  . EKG 12-Lead  . EKG 12-Lead  . ED EKG  . ED EKG    ASSESSMENT AND PLAN:   46 year old female with past medical history significant only for hypertension presents to hospital secondary to a syncopal episode and noted to have extensive left leg DVT.  #1 syncope-likely vasovagal from the pain -CT of the chest negative for any pulmonary embolism. Blood pressure has been stable. No further dizziness or syncopal episodes.  #2 extensive left leg DVT-extending likely into the iliac veins. -Likely predisposing factor could be heard taking birth control pills. No other family history of any blood clots. -Hypercoagulability workup sent out for. -Appreciate vascular consult. Currently on heparin drip -For thrombolysis, thrombectomy and IVC filter placement this afternoon. We'll start eliquis this evening.  #3 hypertension-stable -not on any home medications.  #4 DVT prophylaxis-patient on heparin  drip     All the records are reviewed and case discussed with Care Management/Social Workerr. Management plans discussed with the patient, family and they are in agreement.  CODE STATUS: full code  TOTAL TIME TAKING CARE OF THIS PATIENT: 37 minutes.  POSSIBLE D/C 1-2 days, DEPENDING ON CLINICAL CONDITION.   Glendi Mohiuddin M.D on 09/15/2016 at 1:04 PM  Between 7am to 6pm - Pager - [phone]  After 6pm go to [email or web address] - Social research officer, government  [facility]  Office  [phone]  CC: Primary care physician; Drue Stager, MD

## 2016-09-15 NOTE — Progress Notes (Signed)
Patient post procedure. Coban dressing to left leg for overnight. Tolerated procedure well. Vitals remained stable throughout entire procedure. Report called patient';s care nurse with plan reviewed. Plan for patient to have left leg elevated. May have head of bed elevated per Dr Driscilla Grammes orders. Plan for return to regular diet. Will also plan to restart heparin gtt at previous rate per Dr Driscilla Grammes request.

## 2016-09-15 NOTE — Progress Notes (Signed)
ANTICOAGULATION CONSULT NOTE - Follow Up Consult  Pharmacy Consult for Heparin  Indication: DVT  No Known Allergies  Patient Measurements: Height: 5\' 5"  (165.1 cm) Weight: 151 lb (68.5 kg) IBW/kg (Calculated) : 57 Heparin Dosing Weight: 68.5 kg   Vital Signs: Temp: 98.3 F (36.8 C) (08/29 0511) Temp Source: Oral (08/29 0511) BP: 125/55 (08/29 0511) Pulse Rate: 73 (08/29 0511)  Labs:  Recent Labs  09/14/16 1111 09/14/16 1633 09/14/16 2257 09/15/16 0406  HGB 12.8  --   --  11.9*  HCT 37.0  --   --  34.8*  PLT 349  --   --  275  APTT  --  26  --   --   LABPROT  --  13.6  --   --   INR  --  1.05  --   --   HEPARINUNFRC  --   --  0.53 0.36  CREATININE 0.80  --   --  0.77    Estimated Creatinine Clearance: 84.5 mL/min (by C-G formula based on SCr of 0.77 mg/dL).   Medications:  Scheduled:    Assessment: Pharmacy consulted to dose heparin in this 46 year old female with DVT.   Goal of Therapy:  Heparin level 0.3-0.7 units/ml Monitor platelets by anticoagulation protocol: Yes   Plan:  8/28:  HL @ 23:00 = 0.53.   Will continue this pt on current rate of 1150 units/hr and recheck HL in 6 hrs.   8/29: HL @ 0400 = 0.36.  Will continue this pt on current rate and recheck HL on 8/30 with AM labs.   Abel Hageman D 09/15/2016,5:15 AM

## 2016-09-15 NOTE — Consult Note (Signed)
[facility] Vascular Consult Note  MRN : [id number]  Stacy Juarez is a 45 y.o. (Jan 09, 1971) female who presents with chief complaint of  Chief Complaint  Patient presents with  . Loss of Consciousness  .  History of Present Illness: I am asked to see the patient by Dr. Scotty Court and Dr. Renae Gloss  regarding extensive left lower extremity DVT. The patient is reasonably healthy but had a recent extensive exercise session and has developed severe left leg pain and swelling. This is really only started in the last day or 2. The swelling is quite prominent but is better today with overnight elevation and heparin that was yesterday. She denies a previous history of DVT or superficial thrombophlebitis. No significant right leg symptoms. No chest pain but she does feel weak and somewhat short of breath. No fever or chills. A duplex showed extensive left lower extremity DVT all the way up to the common femoral vein with question of thrombus within the iliac vein. Our institution does not image iliac veins and general and I suspect the DVT progresses further up than they are visualizing  Current Facility-Administered Medications  Medication Dose Route Frequency Provider Last Rate Last Dose  . 0.9 %  sodium chloride infusion   Intravenous Continuous Alford Highland, MD 50 mL/hr at 09/14/16 2244    . acetaminophen (TYLENOL) tablet 650 mg  650 mg Oral Q6H PRN Alford Highland, MD       Or  . acetaminophen (TYLENOL) suppository 650 mg  650 mg Rectal Q6H PRN Wieting, Richard, MD      . heparin ADULT infusion 100 units/mL (25000 units/260mL sodium chloride 0.45%)  1,150 Units/hr Intravenous Continuous Malena Peer D, RPH 11.5 mL/hr at 09/15/16 1058 1,150 Units/hr at 09/15/16 1058  . ondansetron (ZOFRAN) injection 4 mg  4 mg Intravenous Q6H PRN Ramonita Lab, MD        Past Medical History:  Diagnosis Date  . Hypertension     Past Surgical History:  Procedure Laterality  Date  . CHOLECYSTECTOMY    . TONSILLECTOMY      Social History Social History  Substance Use Topics  . Smoking status: Never Smoker  . Smokeless tobacco: Never Used  . Alcohol use No  No IVDU  Family History Family History  Problem Relation Age of Onset  . CAD Mother   . Lymphoma Father   No bleeding or clotting disorders, no aneurysms  No Known Allergies   REVIEW OF SYSTEMS (Negative unless checked)  Constitutional: [] Weight loss  [] Fever  [] Chills Cardiac: [] Chest pain   [] Chest pressure   [] Palpitations   [] Shortness of breath when laying flat   [] Shortness of breath at rest   [] Shortness of breath with exertion. Vascular:  [] Pain in legs with walking   [x] Pain in legs at rest   [] Pain in legs when laying flat   [] Claudication   [] Pain in feet when walking  [] Pain in feet at rest  [] Pain in feet when laying flat   [x] History of DVT   [x] Phlebitis   [x] Swelling in legs   [] Varicose veins   [] Non-healing ulcers Pulmonary:   [] Uses home oxygen   [] Productive cough   [] Hemoptysis   [] Wheeze  [] COPD   [] Asthma Neurologic:  [] Dizziness  [x] Blackouts   [] Seizures   [] History of stroke   [] History of TIA  [] Aphasia   [] Temporary blindness   [] Dysphagia   [] Weakness or numbness in arms   [] Weakness or numbness in legs Musculoskeletal:  []   Arthritis   [] Joint swelling   [] Joint pain   [] Low back pain Hematologic:  [] Easy bruising  [] Easy bleeding   [] Hypercoagulable state   [] Anemic  [] Hepatitis Gastrointestinal:  [] Blood in stool   [] Vomiting blood  [x] Gastroesophageal reflux/heartburn   [] Difficulty swallowing. Genitourinary:  [] Chronic kidney disease   [] Difficult urination  [] Frequent urination  [] Burning with urination   [] Blood in urine Skin:  [] Rashes   [] Ulcers   [] Wounds Psychological:  [] History of anxiety   []  History of major depression.  Physical Examination  Vitals:   09/14/16 1815 09/14/16 2123 09/15/16 0511 09/15/16 0854  BP: (!) 135/51 (!) 119/56 (!) 125/55 120/64   Pulse: 83 79 73 76  Resp: 18 20 20 16   Temp: 98.9 F (37.2 C) 98.3 F (36.8 C) 98.3 F (36.8 C) 98.3 F (36.8 C)  TempSrc: Oral Oral Oral Oral  SpO2: 100% 99% 99% 98%  Weight:      Height:       Body mass index is 25.13 kg/m. Gen:  WD/WN, NAD Head: [place]/AT, No temporalis wasting.  Ear/Nose/Throat: Hearing grossly intact, nares w/o erythema or drainage, oropharynx w/o Erythema/Exudate Eyes: Sclera non-icteric, conjunctiva clear Neck: Trachea midline.  No JVD.  Pulmonary:  Good air movement, respirations not labored, equal bilaterally.  Cardiac: RRR, normal S1, S2. Vascular:  Vessel Right Left  Radial Palpable Palpable                      Popliteal Palpable Trace Palpable  PT Palpable 1+ Palpable  DP Palpable Palpable    Musculoskeletal: M/S 5/5 throughout.  Extremities without ischemic changes.  No deformity or atrophy. 1-2+ LLE edema. Neurologic: Sensation grossly intact in extremities.  Symmetrical.  Speech is fluent. Motor exam as listed above. Psychiatric: Judgment intact, Mood & affect appropriate for pt's clinical situation. Dermatologic: No rashes or ulcers noted.  No cellulitis or open wounds. Lymph : No Cervical, Axillary, or Inguinal lymphadenopathy.      CBC Lab Results  Component Value Date   WBC 8.4 09/15/2016   HGB 11.9 (L) 09/15/2016   HCT 34.8 (L) 09/15/2016   MCV 93.8 09/15/2016   PLT 275 09/15/2016    BMET    Component Value Date/Time   NA 137 09/15/2016 0406   K 3.9 09/15/2016 0406   CL 108 09/15/2016 0406   CO2 25 09/15/2016 0406   GLUCOSE 111 (H) 09/15/2016 0406   BUN 10 09/15/2016 0406   CREATININE 0.77 09/15/2016 0406   CALCIUM 8.4 (L) 09/15/2016 0406   GFRNONAA >60 09/15/2016 0406   GFRAA >60 09/15/2016 0406   Estimated Creatinine Clearance: 84.5 mL/min (by C-G formula based on SCr of 0.77 mg/dL).  COAG Lab Results  Component Value Date   INR 1.05 09/14/2016    Radiology Ct Angio Chest Pe W Or Wo Contrast  Result  Date: 09/15/2016 CLINICAL DATA:  Syncope. Lower extremity DVT. Question pulmonary embolus. EXAM: CT ANGIOGRAPHY CHEST WITH CONTRAST TECHNIQUE: Multidetector CT imaging of the chest was performed using the standard protocol during bolus administration of intravenous contrast. Multiplanar CT image reconstructions and MIPs were obtained to evaluate the vascular anatomy. CONTRAST:  75 cc Isovue 370 IV COMPARISON:  None. FINDINGS: Cardiovascular: No filling defects in the pulmonary arteries to suggest pulmonary emboli. Heart is normal size. Aorta is normal caliber. Mediastinum/Nodes: No mediastinal, hilar, or axillary adenopathy. Trachea and esophagus are unremarkable. Lungs/Pleura: Lungs are clear. No focal airspace opacities or suspicious nodules. No effusions. Upper Abdomen: Imaging  into the upper abdomen shows no acute findings. Musculoskeletal: Chest wall soft tissues are unremarkable. No acute bony abnormality. Review of the MIP images confirms the above findings. IMPRESSION: No evidence of pulmonary embolus. No acute cardiopulmonary disease Electronically Signed   By: Charlett Nose M.D.   On: 09/15/2016 08:57   US Venous Img Lower Unilateral Left  Result Date: 09/14/2016 CLINICAL DATA:  Left leg pain and swelling. Patient on birth control. EXAM: LEFT LOWER EXTREMITY VENOUS DOPPLER ULTRASOUND TECHNIQUE: Gray-scale sonography with graded compression, as well as color Doppler and duplex ultrasound were performed to evaluate the lower extremity deep venous systems from the level of the common femoral vein and including the common femoral, femoral, profunda femoral, popliteal and calf veins including the posterior tibial, peroneal and gastrocnemius veins when visible. The superficial great saphenous vein was also interrogated. Spectral Doppler was utilized to evaluate flow at rest and with distal augmentation maneuvers in the common femoral, femoral and popliteal veins. COMPARISON:  None. FINDINGS: Contralateral  Common Femoral Vein: Respiratory phasicity is normal and symmetric with the symptomatic side. No evidence of thrombus. Normal compressibility. Common Femoral Vein: Occlusive thrombosis of the CFV. No compressibility or venous waveforms are demonstrated. Augmentation not performed. Saphenofemoral Junction: Nonocclusive thrombosis is noted with minimal color doppler flow noted within. Profunda Femoral Vein: Nonocclusive thrombosis is noted with some flow demonstrated . Femoral Vein: Nonocclusive thrombosis is noted in the mid to distal femoral vein with some color Doppler and venous waveforms detected in the proximal and distal femoral vein. Popliteal Vein: Nonocclusive thrombosis with some flow demonstrated in the proximal as well as distal popliteal veins. Calf Veins: No evidence of thrombus. Normal compressibility and flow on color Doppler imaging. Superficial Great Saphenous Vein: No evidence of thrombus. Normal compressibility and flow on color Doppler imaging. Venous Reflux:  None. Other Findings: The left external iliac vein was difficult to assess and extension of thrombus cannot be entirely excluded within. IMPRESSION: 1. Occlusive DVT of the left common femoral vein with nonocclusive thrombus from GSV junction through popliteal vein. These results will be called to the ordering clinician or representative by the Radiologist Assistant, and communication documented in the PACS or zVision Dashboard. 2. There may also be thrombosis of the left external iliac vein but this is not well characterized given its location sonographically. Electronically Signed   By: Tollie Eth M.D.   On: 09/14/2016 15:33      Assessment/Plan 1. Extensive left lower extremity DVT. Acute in onset. We had a long discussion today regarding options for treatment. 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 Her risk of postphlebitic symptoms from this extensive DVT is very  high, and treatment of this with thrombolytic therapy and thrombectomy would be the best option in reducing these long-term risks. I have discussed the procedure in detail. I discussed the risks and benefits of the procedure. I discussed that she would continue anticoagulation and will need to be on this for at least 6 months. She voices her understanding and desires to proceed with intervention today 2. HTN. Stable on outpatient medications and blood pressure control important in reducing the progression of atherosclerotic disease. On appropriate oral medications.    Festus Barren, MD  09/15/2016 11:07 AM    This note was created with Dragon medical transcription system.  Any error is purely unintentional

## 2016-09-16 ENCOUNTER — Encounter: Payer: Self-pay | Admitting: Vascular Surgery

## 2016-09-16 LAB — CBC
HEMATOCRIT: 29.8 % — AB (ref 35.0–47.0)
HEMOGLOBIN: 10.3 g/dL — AB (ref 12.0–16.0)
MCH: 32.2 pg (ref 26.0–34.0)
MCHC: 34.5 g/dL (ref 32.0–36.0)
MCV: 93.2 fL (ref 80.0–100.0)
Platelets: 206 10*3/uL (ref 150–440)
RBC: 3.19 MIL/uL — ABNORMAL LOW (ref 3.80–5.20)
RDW: 12.5 % (ref 11.5–14.5)
WBC: 8.5 10*3/uL (ref 3.6–11.0)

## 2016-09-16 LAB — BASIC METABOLIC PANEL
Anion gap: 3 — ABNORMAL LOW (ref 5–15)
BUN: 7 mg/dL (ref 6–20)
CALCIUM: 7.9 mg/dL — AB (ref 8.9–10.3)
CHLORIDE: 110 mmol/L (ref 101–111)
CO2: 25 mmol/L (ref 22–32)
CREATININE: 0.69 mg/dL (ref 0.44–1.00)
GFR calc Af Amer: >60 mL/min (ref >60)
GFR calc non Af Amer: >60 mL/min (ref >60)
Glucose, Bld: 103 mg/dL — ABNORMAL HIGH (ref 65–99)
Potassium: 3.6 mmol/L (ref 3.5–5.1)
Sodium: 138 mmol/L (ref 135–145)

## 2016-09-16 LAB — HEPARIN LEVEL (UNFRACTIONATED): HEPARIN UNFRACTIONATED: 0.7 [IU]/mL (ref 0.30–0.70)

## 2016-09-16 LAB — HIV ANTIBODY (ROUTINE TESTING W REFLEX): HIV Screen 4th Generation wRfx: NONREACTIVE

## 2016-09-16 LAB — PROTEIN C ACTIVITY: PROTEIN C ACTIVITY: 139 % (ref 73–180)

## 2016-09-16 LAB — PROTEIN S ACTIVITY: Protein S Activity: 56 % — ABNORMAL LOW (ref 63–140)

## 2016-09-16 LAB — HOMOCYSTEINE: HOMOCYSTEINE-NORM: 5.7 umol/L (ref 0.0–15.0)

## 2016-09-16 LAB — PROTEIN S, TOTAL: PROTEIN S AG TOTAL: 66 % (ref 60–150)

## 2016-09-16 MED ORDER — APIXABAN 5 MG PO TABS: 5.0000 mg | ORAL_TABLET | Freq: Two times a day (BID) | ORAL | Status: DC

## 2016-09-16 MED ORDER — APIXABAN 5 MG PO TABS
10.0000 mg | ORAL_TABLET | Freq: Two times a day (BID) | ORAL | Status: DC
  Administered 2016-09-16: 10 mg via ORAL
  Filled 2016-09-16: qty 2

## 2016-09-16 MED ORDER — APIXABAN 5 MG PO TABS: ORAL_TABLET | ORAL | 1 refills | Status: DC

## 2016-09-16 MED ORDER — TRAMADOL HCL 50 MG PO TABS: 50.0000 mg | ORAL_TABLET | Freq: Four times a day (QID) | ORAL | 0 refills | Status: DC | PRN

## 2016-09-16 NOTE — Care Management (Signed)
Eliquis 10 mg BID for 1 week and then 5 mg BID there after #60 called to [phone]) [phone]  per Dr. Nemiah CommanderKalisetti for price check/authorization.  Patient has also received coupon for free 30-day Eliquis from this RNCM.

## 2016-09-16 NOTE — Plan of Care (Signed)
Problem: Safety: Goal: Ability to remain free from injury will improve Outcome: Progressing Up to the bathroom without complications. No bleeding noted. Left leg propped up on pillow.Continues on heparin drip at 11.5 mls/hr. Oxycodone prn for severe pain with improvement. Tylenol for mild pain with relief. Continue to monitor.

## 2016-09-16 NOTE — Progress Notes (Signed)
Deflated air balloon and removed dressing, and applied gauze with tagaderm dressing on pt's groin. Pt tolerated well. No bleeding noted.

## 2016-09-16 NOTE — Progress Notes (Signed)
ANTICOAGULATION CONSULT NOTE - Follow Up Consult  Pharmacy Consult for Heparin  Indication: DVT  No Known Allergies  Patient Measurements: Height: 5\' 5"  (165.1 cm) Weight: 151 lb (68.5 kg) IBW/kg (Calculated) : 57 Heparin Dosing Weight: 68.5 kg   Vital Signs: Temp: 98 F (36.7 C) (08/30 0420) Temp Source: Oral (08/30 0420) BP: 113/55 (08/30 0420) Pulse Rate: 76 (08/30 0420)  Labs:  Recent Labs  09/14/16 1111 09/14/16 1633 09/14/16 2257 09/15/16 0406 09/16/16 0407  HGB 12.8  --   --  11.9* 10.3*  HCT 37.0  --   --  34.8* 29.8*  PLT 349  --   --  275 206  APTT  --  26  --   --   --   LABPROT  --  13.6  --   --   --   INR  --  1.05  --   --   --   HEPARINUNFRC  --   --  0.53 0.36 0.70  CREATININE 0.80  --   --  0.77 0.69    Estimated Creatinine Clearance: 84.5 mL/min (by C-G formula based on SCr of 0.69 mg/dL).   Medications:  Scheduled:  . ondansetron (ZOFRAN) IV  4 mg Intravenous Once    Assessment: Pharmacy consulted to dose heparin in this 46 year old female with DVT.   Goal of Therapy:  Heparin level 0.3-0.7 units/ml Monitor platelets by anticoagulation protocol: Yes   Plan:  8/28:  HL @ 23:00 = 0.53.   Will continue this pt on current rate of 1150 units/hr and recheck HL in 6 hrs.   8/29: HL @ 0400 = 0.36.  Will continue this pt on current rate and recheck HL on 8/30 with AM labs.   8/30: HL @ 0400 = 0.7  Will continue this pt on current rate and recheck HL on 8/31 with AM labs.   Joniyah Mallinger D 09/16/2016,5:13 AM

## 2016-09-16 NOTE — Discharge Instructions (Signed)
Information on my medicine - ELIQUIS (apixaban)  This medication education was reviewed with me or my healthcare representative as part of my discharge preparation.  The pharmacist that spoke with me during my hospital stay was:  Olene FlossMelissa D Keonna Raether, [facility]  Why was Eliquis prescribed for you? Eliquis was prescribed to treat blood clots that may have been found in the veins of your legs (deep vein thrombosis) or in your lungs (pulmonary embolism) and to reduce the risk of them occurring again.  What do You need to know about Eliquis ? The starting dose is 10 mg (two 5 mg tablets) taken TWICE daily for the FIRST SEVEN (7) DAYS, then on (enter date)  09/23/16  the dose is reduced to ONE 5 mg tablet taken TWICE daily.  Eliquis may be taken with or without food.   Try to take the dose about the same time in the morning and in the evening. If you have difficulty swallowing the tablet whole please discuss with your pharmacist how to take the medication safely.  Take Eliquis exactly as prescribed and DO NOT stop taking Eliquis without talking to the doctor who prescribed the medication.  Stopping may increase your risk of developing a new blood clot.  Refill your prescription before you run out.  After discharge, you should have regular check-up appointments with your healthcare provider that is prescribing your Eliquis.    What do you do if you miss a dose? If a dose of ELIQUIS is not taken at the scheduled time, take it as soon as possible on the same day and twice-daily administration should be resumed. The dose should not be doubled to make up for a missed dose.  Important Safety Information A possible side effect of Eliquis is bleeding. You should call your healthcare provider right away if you experience any of the following: ? Bleeding from an injury or your nose that does not stop. ? Unusual colored urine (red or dark brown) or unusual colored stools (red or black). ? Unusual bruising for  unknown reasons. ? A serious fall or if you hit your head (even if there is no bleeding).  Some medicines may interact with Eliquis and might increase your risk of bleeding or clotting while on Eliquis. To help avoid this, consult your healthcare provider or pharmacist prior to using any new prescription or non-prescription medications, including herbals, vitamins, non-steroidal anti-inflammatory drugs (NSAIDs) and supplements.  This website has more information on Eliquis (apixaban): http://[email or web address].com/eliquis/home

## 2016-09-16 NOTE — Discharge Summary (Signed)
[facility] [place] [facility]   PATIENT NAME: Stacy Juarez    MR#:  [id number]  DATE OF BIRTH:  September 12, 1970  DATE OF ADMISSION:  09/14/2016   ADMITTING PHYSICIAN: Alford Highland, MD  DATE OF DISCHARGE: 09/16/2016  PRIMARY CARE PHYSICIAN: Drue Stager, MD   ADMISSION DIAGNOSIS:   Vasovagal syncope [R55] Acute deep vein thrombosis (DVT) of femoral vein of left lower extremity (HCC) [I82.412]  DISCHARGE DIAGNOSIS:   Active Problems:   DVT (deep venous thrombosis) (HCC)   SECONDARY DIAGNOSIS:   Past Medical History:  Diagnosis Date  . Hypertension     HOSPITAL COURSE:   46 year old female with past medical history significant only for hypertension presents to hospital secondary to a syncopal episode and noted to have extensive left leg DVT.  #1 syncope-likely vasovagal from the pain -CT of the chest negative for any pulmonary embolism. Blood pressure has been stable. No further dizziness or syncopal episodes.  #2 extensive left leg DVT-extending into the iliac veins. 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 -Appreciate vascular consult. Status post from hemolysis, thrombectomy, iliac vein stent placement to prevent further compression. Also had IVC filter put in. -Has been on heparin drip since admission. Discontinue it and changed to oral eliquis. -Will need anticoagulation for at least a year. Outpatient follow-up with vascular surgery in one month. Hypercoagulable workup has been sent out. -encouraged to wear compression stockings while upright  #3 hypertension-stable -continue low dose losartan  Stable for discharge today. Has been ambulating in the room with minimal pain.  DISCHARGE CONDITIONS:   Guarded  CONSULTS OBTAINED:   Treatment  Team:  Annice Needy, MD  DRUG ALLERGIES:   No Known Allergies DISCHARGE MEDICATIONS:   Allergies as of 09/16/2016   No Known Allergies     Medication List    STOP taking these medications   levonorgestrel-ethinyl estradiol 0.15-30 MG-MCG tablet Commonly known as:  NORDETTE     TAKE these medications   apixaban 5 MG Tabs tablet Commonly known as:  ELIQUIS Take 2 tablets by mouth twice a day for 1 week and then 1 tablet twice a day after that   KRILL OIL PO Take 1 capsule by mouth daily.   losartan 25 MG tablet Commonly known as:  COZAAR Take 25 mg by mouth daily.   traMADol 50 MG tablet Commonly known as:  ULTRAM Take 1 tablet (50 mg total) by mouth every 6 (six) hours as needed.            Discharge Care Instructions        Start     Ordered   09/16/16 0000  traMADol (ULTRAM) 50 MG tablet  Every 6 hours PRN     09/16/16 0847   09/16/16 0000  apixaban (ELIQUIS) 5 MG TABS tablet     09/16/16 0847   09/16/16 0000  Activity as tolerated - No restrictions     09/16/16 0847   09/16/16 0000  Diet general     09/16/16 0847       DISCHARGE INSTRUCTIONS:   1. PCP follow-up in 1-2 weeks 2. Vascular surgery follow-up in 4 weeks  DIET:   Cardiac diet  ACTIVITY:   Activity as tolerated  OXYGEN:   Home Oxygen: No.  Oxygen Delivery: room air  DISCHARGE LOCATION:   home   If you experience worsening of your admission symptoms, develop shortness of breath, life threatening emergency, suicidal or homicidal thoughts you must seek medical attention immediately by calling 911 or calling your MD immediately  if symptoms less severe.  You Must read complete instructions/literature along with all the possible adverse reactions/side effects for all the Medicines you take and that have been prescribed to you. Take any new Medicines after you have completely understood and accpet all the possible adverse reactions/side effects.   Please note  You were cared for  by a hospitalist during your hospital stay. If you have any questions about your discharge medications or the care you received while you were in the hospital after you are discharged, you can call the unit and asked to speak with the hospitalist on call if the hospitalist that took care of you is not available. Once you are discharged, your primary care physician will handle any further medical issues. Please note that NO REFILLS for any discharge medications will be authorized once you are discharged, as it is imperative that you return to your primary care physician (or establish a relationship with a primary care physician if you do not have one) for your aftercare needs so that they can reassess your need for medications and monitor your lab values.    On the day of Discharge:  VITAL SIGNS:   Blood pressure (!) 113/55, pulse 76, temperature 98 F (36.7 C), temperature source Oral, resp. rate 17, height 5\' 5"  (1.651 m), weight 68.5 kg (151 lb), last menstrual period 07/15/2016, SpO2 99 %.  PHYSICAL EXAMINATION:   GENERAL:  46 y.o.-year-old patient lying in the bed with no acute distress.  EYES: Pupils equal, round, reactive to light and accommodation. No scleral icterus. Extraocular muscles intact.  HEENT: Head atraumatic, normocephalic. Oropharynx and nasopharynx clear.  NECK:  Supple, no jugular venous distention. No thyroid enlargement, no tenderness.  LUNGS: Normal breath sounds bilaterally, no wheezing, rales,rhonchi or crepitation. No use of accessory muscles of respiration.  CARDIOVASCULAR: S1, S2 normal. No murmurs, rubs, or gallops.  ABDOMEN: Soft, nontender, nondistended. Bowel sounds present. No organomegaly or mass.  EXTREMITIES: improved left leg swelling, not erythematous. -No pedal edema, cyanosis, or clubbing. Right groin site looks neat without any hematoma or swelling. NEUROLOGIC: Cranial nerves II through XII are intact. Muscle strength 5/5 in all extremities. Sensation  intact. Gait not checked.  PSYCHIATRIC: The patient is alert and oriented x 3.  SKIN: No obvious rash, lesion, or ulcer.    DATA REVIEW:   CBC  Recent Labs Lab 09/16/16 0407  WBC 8.5  HGB 10.3*  HCT 29.8*  PLT 206    Chemistries   Recent Labs Lab 09/16/16 0407  NA 138  K 3.6  CL 110  CO2 25  GLUCOSE 103*  BUN 7  CREATININE 0.69  CALCIUM 7.9*     Microbiology Results  No results found for this or any previous visit.  RADIOLOGY:  No results found.   Management plans discussed with the patient, family and they are in agreement.  CODE STATUS:     Code Status Orders        Start     Ordered   09/14/16 1729  Full code  Continuous     09/14/16 1729    Code Status History    Date Active Date Inactive Code Status Order ID Comments User Context   This patient has a current code status but  no historical code status.      TOTAL TIME TAKING CARE OF THIS PATIENT: 37 minutes.    Annalyse Langlais M.D on 09/16/2016 at 8:50 AM  Between 7am to 6pm - Pager - [phone]  After 6pm go to [email or web address] - Social research officer, governmentpassword [email or web address]  [facility] [place] [facility]  Office  [phone]  CC: Primary care physician; Drue StagerStetson, Margaret A, MD   Note: This dictation was prepared with Dragon dictation along with smaller phrase technology. Any transcriptional errors that result from this process are unintentional.

## 2016-09-16 NOTE — Progress Notes (Addendum)
ANTICOAGULATION CONSULT NOTE - Initial Consult  Pharmacy Consult for apixaban Indication: DVT  No Known Allergies  Patient Measurements: Height: 5\' 5"  (165.1 cm) Weight: 151 lb (68.5 kg) IBW/kg (Calculated) : 57  Vital Signs: Temp: 98 F (36.7 C) (08/30 0420) Temp Source: Oral (08/30 0420) BP: 113/55 (08/30 0420) Pulse Rate: 76 (08/30 0420)  Labs:  Recent Labs  09/14/16 1111 09/14/16 1633 09/14/16 2257 09/15/16 0406 09/16/16 0407  HGB 12.8  --   --  11.9* 10.3*  HCT 37.0  --   --  34.8* 29.8*  PLT 349  --   --  275 206  APTT  --  26  --   --   --   LABPROT  --  13.6  --   --   --   INR  --  1.05  --   --   --   HEPARINUNFRC  --   --  0.53 0.36 0.70  CREATININE 0.80  --   --  0.77 0.69    Estimated Creatinine Clearance: 84.5 mL/min (by C-G formula based on SCr of 0.69 mg/dL).   Medical History: Past Medical History:  Diagnosis Date  . Hypertension     Assessment: Pharmacy consulted to dose apixaban in this 46 year old female admitted with a DVT. Patient is being converted from a heparin drip to apixaban s/p IVC filter placement.  Plan:  Heparin drip was discontinued by MD Start apixaban 10 mg PO BID x  7 days followed by apixaban 5 mg PO BID CBC/SCr to be checked every 72 hours per protocol  Cindi CarbonMary M Bemnet Trovato, PharmD, BCPS 09/16/2016,7:37 AM  Addendum: Counseling completed by pharmacy on 8/30.  Cindi CarbonMary M Kayton Ripp, PharmD 09/16/16 8:37 AM

## 2016-09-16 NOTE — Progress Notes (Signed)
Deflated 20 mls of air from air balloon from pt's right groin, will deflate 20 mls more before shift change. Continue to monitor

## 2016-09-16 NOTE — Progress Notes (Signed)
Patient discharged home per MD order. Prescriptions given to patient. All discharge instructions given and all questions answered. 

## 2016-09-17 ENCOUNTER — Encounter: Payer: Self-pay | Admitting: Vascular Surgery

## 2016-09-17 LAB — LUPUS ANTICOAGULANT PANEL
DRVVT: 29.7 s (ref 0.0–47.0)
PTT Lupus Anticoagulant: 35.6 s (ref 0.0–51.9)

## 2016-09-17 LAB — PROTEIN C, TOTAL: Protein C, Total: 109 % (ref 60–150)

## 2016-09-17 LAB — ECHOCARDIOGRAM COMPLETE
Height: 65 in
Weight: 2416 oz

## 2016-09-17 LAB — BETA-2-GLYCOPROTEIN I ABS, IGG/M/A

## 2016-09-17 LAB — CARDIOLIPIN ANTIBODIES, IGG, IGM, IGA: Anticardiolipin IgA: <9 APL U/mL (ref 0–11)

## 2016-09-21 LAB — PROTHROMBIN GENE MUTATION

## 2016-09-22 LAB — FACTOR 5 LEIDEN

## 2016-10-15 ENCOUNTER — Ambulatory Visit (INDEPENDENT_AMBULATORY_CARE_PROVIDER_SITE_OTHER): Payer: Managed Care, Other (non HMO) | Admitting: Vascular Surgery

## 2016-10-15 ENCOUNTER — Encounter (INDEPENDENT_AMBULATORY_CARE_PROVIDER_SITE_OTHER): Payer: Self-pay | Admitting: Vascular Surgery

## 2016-10-15 VITALS — BP 123/70 | HR 76 | Resp 17 | Ht 65.5 in | Wt 150.0 lb

## 2016-10-15 DIAGNOSIS — I82412 Acute embolism and thrombosis of left femoral vein: Secondary | ICD-10-CM | POA: Diagnosis not present

## 2016-10-15 DIAGNOSIS — I10 Essential (primary) hypertension: Secondary | ICD-10-CM | POA: Diagnosis not present

## 2016-10-15 NOTE — Progress Notes (Signed)
Subjective:    Patient ID: Stacy Juarez, female    DOB: 01-Jun-1970, 46 y.o.   MRN: [id number] Chief Complaint  Patient presents with  . Follow-up    hospital follow up   Presents as a new patient. Patient was last seen at [facility] and underwent an endovascular thrombolysis and IVC filter placement for an unprovoked left lower extremity extensive DVT. Patient presents today without complaint. Denies any swelling to her lower extremity. Denies any shortness of breath or chest pain. She continues to take Eliquis on a daily basis. She denies any fever, nausea or vomiting.   Review of Systems  Constitutional: Negative.   HENT: Negative.   Eyes: Negative.   Respiratory: Negative.   Cardiovascular:       DVT  Gastrointestinal: Negative.   Endocrine: Negative.   Genitourinary: Negative.   Musculoskeletal: Negative.   Skin: Negative.   Allergic/Immunologic: Negative.   Neurological: Negative.   Hematological: Negative.   Psychiatric/Behavioral: Negative.       Objective:   Physical Exam  Constitutional: She is oriented to person, place, and time. She appears well-developed and well-nourished. No distress.  HENT:  Head: Normocephalic and atraumatic.  Eyes: Pupils are equal, round, and reactive to light. Conjunctivae are normal.  Neck: Normal range of motion.  Cardiovascular: Normal rate, regular rhythm, normal heart sounds and intact distal pulses.   Pulses:      Radial pulses are 2+ on the right side, and 2+ on the left side.       Dorsalis pedis pulses are 2+ on the right side, and 2+ on the left side.       Posterior tibial pulses are 2+ on the right side, and 2+ on the left side.  No pain with dorsiflexion bilaterally  Calves are non-tender to palpation  Pulmonary/Chest: Effort normal.  Musculoskeletal: Normal range of motion. She exhibits edema (very mild bilateral lower extremity edema).  Neurological: She is alert and oriented to person,  place, and time.  Skin: Skin is warm and dry. She is not diaphoretic.  No cellulitis. Skin is intact.  Psychiatric: She has a normal mood and affect. Her behavior is normal. Judgment and thought content normal.  Vitals reviewed.  BP 123/70 (BP Location: Right Arm)   Pulse 76   Resp 17   Ht 5' 5.5" (1.664 m)   Wt 150 lb (68 kg)   BMI 24.58 kg/m   Past Medical History:  Diagnosis Date  . Hypertension     Social History   Social History  . Marital status: Married    Spouse name: N/A  . Number of children: N/A  . Years of education: N/A   Occupational History  . Not on file.   Social History Main Topics  . Smoking status: Never Smoker  . Smokeless tobacco: Never Used  . Alcohol use No  . Drug use: No  . Sexual activity: Yes    Birth control/ protection: Pill   Other Topics Concern  . Not on file   Social History Narrative  . No narrative on file    Past Surgical History:  Procedure Laterality Date  . CHOLECYSTECTOMY    . PERIPHERAL VASCULAR THROMBECTOMY Left 09/15/2016   Procedure: PERIPHERAL VASCULAR THROMBECTOMY, venous, and IVC filter;  Surgeon: Annice Needy, MD;  Location: [facility] INVASIVE CV LAB;  Service: Cardiovascular;  Laterality: Left;  . TONSILLECTOMY      Family History  Problem Relation Age of Onset  .  CAD Mother   . Lymphoma Father     Allergies  Allergen Reactions  . Lisinopril Nausea And Vomiting    And diarrhea.       Assessment & Plan:  Presents as a new patient. Patient was last seen at [facility] and underwent an endovascular thrombolysis and IVC filter placement for an unprovoked left lower extremity extensive DVT. Patient presents today without complaint. Denies any swelling to her lower extremity. Denies any shortness of breath or chest pain. She continues to take Eliquis on a daily basis. She denies any fever, nausea or vomiting.  1. Acute deep vein thrombosis (DVT) of femoral vein of left lower extremity (HCC)  - new Patient is status post a endovascular thrombo-lysis and IVC filter placement for an extensive non-provoked left lower extremity DVT Patient is without complaint Physical exam is unremarkable Patient to continue taking Eliquis 5 mg 1 tab by mouth every 12 h Patient to return in about a month to repeat a bilateral DVT study At that point we will discuss the removal of the IVC filter Patient may benefit by seeing a hematologist in the future Patient expresses her understanding  2. Hypertension, unspecified type - stable Encouraged good control as its slows the progression of atherosclerotic disease   Current Outpatient Prescriptions on File Prior to Visit  Medication Sig Dispense Refill  . apixaban (ELIQUIS) 5 MG TABS tablet Take 2 tablets by mouth twice a day for 1 week and then 1 tablet twice a day after that 74 tablet 1  . KRILL OIL PO Take 1 capsule by mouth daily.    Marland Kitchen losartan (COZAAR) 25 MG tablet Take 25 mg by mouth daily.    . traMADol (ULTRAM) 50 MG tablet Take 1 tablet (50 mg total) by mouth every 6 (six) hours as needed. (Patient not taking: Reported on 10/15/2016) 20 tablet 0   No current facility-administered medications on file prior to visit.     There are no Patient Instructions on file for this visit. No Follow-up on file.   Ilissa Rosner A Elford Evilsizer, PA-C

## 2016-11-02 ENCOUNTER — Ambulatory Visit: Payer: Self-pay | Admitting: Physician Assistant

## 2016-11-02 ENCOUNTER — Encounter: Payer: Self-pay | Admitting: Physician Assistant

## 2016-11-02 VITALS — BP 100/60 | HR 95 | Temp 98.5°F | Resp 16

## 2016-11-02 DIAGNOSIS — B001 Herpesviral vesicular dermatitis: Secondary | ICD-10-CM | POA: Insufficient documentation

## 2016-11-02 DIAGNOSIS — G43909 Migraine, unspecified, not intractable, without status migrainosus: Secondary | ICD-10-CM | POA: Insufficient documentation

## 2016-11-02 DIAGNOSIS — N644 Mastodynia: Secondary | ICD-10-CM

## 2016-11-02 NOTE — Progress Notes (Signed)
S: pt recently had dvt in left leg, had thromboectomy, filter and stent placed, was on bcp but stopped after dvt dx; now she is complaining of r breast nodule/pain, left sided breast pain, no drainage from nipples, no redness or swelling, did have a period right away after stopping her pills, no fever/chills, no cp/sob, no v/d  O: vitals wnl, nad, lungs c t a,c v rrr, r breast is tender in r upper outer quad, no nodule felt at this time, left breast is tender to palp, no drainage from nipples, no redness or swelling noted, no dimpling noted, n/v intact  A: breast pain and nodule  P: mammogram ordered, pt to f/u with vascular, hematology

## 2016-11-08 ENCOUNTER — Inpatient Hospital Stay
Admission: RE | Admit: 2016-11-08 | Discharge: 2016-11-08 | Disposition: A | Discharge status: Home / Self Care | Payer: Self-pay | Source: Ambulatory Visit | Attending: *Deleted | Admitting: *Deleted

## 2016-11-08 ENCOUNTER — Other Ambulatory Visit: Payer: Self-pay | Admitting: *Deleted

## 2016-11-08 DIAGNOSIS — Z9289 Personal history of other medical treatment: Secondary | ICD-10-CM

## 2016-11-10 NOTE — Addendum Note (Signed)
Addended by: Catha BrowEACON, MONIQUE T on: 11/10/2016 03:05 PM   Modules accepted: Orders

## 2016-11-11 ENCOUNTER — Ambulatory Visit
Admission: RE | Admit: 2016-11-11 | Discharge: 2016-11-11 | Disposition: A | Discharge status: Home / Self Care | Payer: Managed Care, Other (non HMO) | Source: Ambulatory Visit | Attending: Physician Assistant | Admitting: Physician Assistant

## 2016-11-11 DIAGNOSIS — N644 Mastodynia: Secondary | ICD-10-CM | POA: Diagnosis present

## 2016-11-16 ENCOUNTER — Ambulatory Visit (INDEPENDENT_AMBULATORY_CARE_PROVIDER_SITE_OTHER): Payer: Managed Care, Other (non HMO)

## 2016-11-16 ENCOUNTER — Ambulatory Visit (INDEPENDENT_AMBULATORY_CARE_PROVIDER_SITE_OTHER): Payer: Managed Care, Other (non HMO) | Admitting: Vascular Surgery

## 2016-11-16 VITALS — BP 138/79 | HR 77 | Resp 15 | Ht 65.5 in | Wt 150.0 lb

## 2016-11-16 DIAGNOSIS — I82412 Acute embolism and thrombosis of left femoral vein: Secondary | ICD-10-CM

## 2016-11-16 DIAGNOSIS — I82403 Acute embolism and thrombosis of unspecified deep veins of lower extremity, bilateral: Secondary | ICD-10-CM

## 2016-11-16 DIAGNOSIS — I10 Essential (primary) hypertension: Secondary | ICD-10-CM | POA: Diagnosis not present

## 2016-11-16 DIAGNOSIS — Z95828 Presence of other vascular implants and grafts: Secondary | ICD-10-CM | POA: Diagnosis not present

## 2016-11-16 NOTE — Assessment & Plan Note (Addendum)
Her duplex today shows basically complete resolution of her DVT bilaterally. At this point, this is a dramatic improvement and likely significantly improved by her percutaneous intervention.  She should still get a full 6 months of anticoagulation.  Her filter can be removed.  She can wear compression stockings and elevate her legs to avoid postphlebitic symptoms, although at current she is basically asymptomatic.

## 2016-11-16 NOTE — Progress Notes (Signed)
MRN : [id number]  Stacy Juarez is a 46 y.o. (1970-07-18) female who presents with chief complaint of  Chief Complaint  Patient presents with  . Follow-up    1 month DVT study  .  History of Present Illness: Patient returns today in follow up of her DVT. She is about 2 months s/p percutaneous thrombectomy and thrombolytic therapy to treat and extensive bilateral lower extremity DVT.  The bilateral component was not noted until the procedure was performed.  She has done well from this.  She remains on anticoagulation without any signs of bleeding her leg swelling and pain have significantly she also had an IVC filter placed at the time of her procedure. Her duplex today shows basically complete resolution of her DVT bilaterally.  Current Outpatient Prescriptions  Medication Sig Dispense Refill  . apixaban (ELIQUIS) 5 MG TABS tablet Take 2 tablets by mouth twice a day for 1 week and then 1 tablet twice a day after that 74 tablet 1  . losartan (COZAAR) 25 MG tablet Take 25 mg by mouth daily.     No current facility-administered medications for this visit.     Past Medical History:  Diagnosis Date  . Hypertension     Past Surgical History:  Procedure Laterality Date  . CHOLECYSTECTOMY    . PERIPHERAL VASCULAR THROMBECTOMY Left 09/15/2016   Procedure: PERIPHERAL VASCULAR THROMBECTOMY, venous, and IVC filter;  Surgeon: Algernon Huxley, MD;  Location: [facility];  Service: Cardiovascular;  Laterality: Left;  . TONSILLECTOMY      Social History Social History  Substance Use Topics  . Smoking status: Never Smoker  . Smokeless tobacco: Never Used  . Alcohol use No     Family History Family History  Problem Relation Age of Onset  . CAD Mother   . Lymphoma Father      Allergies  Allergen Reactions  . Lisinopril Nausea And Vomiting    And diarrhea.     REVIEW OF SYSTEMS (Negative unless checked)  Constitutional: _0 Weight loss  _1 Fever  _2 Chills Cardiac:  _3 Chest pain   _4 Chest pressure   _5 Palpitations   _6 Shortness of breath when laying flat   _7 Shortness of breath at rest   _8 Shortness of breath with exertion. Vascular:  _9 Pain in legs with walking   _10 Pain in legs at rest   _11 Pain in legs when laying flat   _12 Claudication   _13 Pain in feet when walking  _14 Pain in feet at rest  _15 Pain in feet when laying flat   _16 History of DVT   _17 Phlebitis   _18 Swelling in legs   _19 Varicose veins   _20 Non-healing ulcers Pulmonary:   _21 Uses home oxygen   _22 Productive cough   _23 Hemoptysis   _24 Wheeze  _25 COPD   _26 Asthma Neurologic:  _27 Dizziness  _28 Blackouts   _29 Seizures   _30 History of stroke   _31 History of TIA  _32 Aphasia   _33 Temporary blindness   _34 Dysphagia   _35 Weakness or numbness in arms   _36 Weakness or numbness in legs Musculoskeletal:  _37 Arthritis   _38 Joint swelling   _39 Joint pain   _40 Low back pain Hematologic:  _41 Easy bruising  _42 Easy bleeding   _43 Hypercoagulable state   _44 Anemic   Gastrointestinal:  _45 Blood in stool   _46 Vomiting blood  _47 Gastroesophageal reflux/heartburn   _48 Abdominal pain Genitourinary:  _49 Chronic kidney disease   _50 Difficult urination  _51 Frequent urination  _52 Burning with urination   _53 Hematuria Skin:  _54 Rashes   _55 Ulcers   _56 Wounds Psychological:  _57 History of anxiety   _58   History of major depression.  Physical Examination  BP 138/79 (BP Location: Right Arm)   Pulse 77   Resp 15   Ht 5' 5.5" (1.664 m)   Wt 68 kg (150 lb)   BMI 24.58 kg/m  Gen:  WD/WN, NAD Head: [place]/AT, No temporalis wasting. Ear/Nose/Throat: Hearing grossly intact, nares w/o erythema or drainage, trachea midline Eyes: Conjunctiva clear. Sclera non-icteric Neck: Supple.  No JVD.  Pulmonary:  Good air movement, no use of accessory muscles.  Cardiac: RRR, normal S1, S2 Vascular:  Vessel Right Left  Radial Palpable Palpable                          PT Palpable Palpable  DP Palpable Palpable    Musculoskeletal: M/S 5/5 throughout.  No deformity or  atrophy. No LE edema. Neurologic: Sensation grossly intact in extremities.  Symmetrical.  Speech is fluent.  Psychiatric: Judgment intact, Mood & affect appropriate for pt's clinical situation. Dermatologic: No rashes or ulcers noted.  No cellulitis or open wounds.       Labs Recent Results (from the past 2160 hour(s))  Glucose, capillary     Status: Abnormal   Collection Time: 09/14/16 11:10 AM  Result Value Ref Range   Glucose-Capillary 172 (H) 65 - 99 mg/dL   Comment 1 Notify RN   Basic metabolic panel     Status: Abnormal   Collection Time: 09/14/16 11:11 AM  Result Value Ref Range   Sodium 132 (L) 135 - 145 mmol/L   Potassium 4.0 3.5 - 5.1 mmol/L   Chloride 102 101 - 111 mmol/L   CO2 24 22 - 32 mmol/L   Glucose, Bld 184 (H) 65 - 99 mg/dL   BUN 12 6 - 20 mg/dL   Creatinine, Ser 0.80 0.44 - 1.00 mg/dL   Calcium 8.8 (L) 8.9 - 10.3 mg/dL   GFR calc non Af Amer >60 >60 mL/min   GFR calc Af Amer >60 >60 mL/min    Comment: (NOTE) The eGFR has been calculated using the CKD EPI equation. This calculation has not been validated in all clinical situations. eGFR's persistently <60 mL/min signify possible Chronic Kidney Disease.    Anion gap 6 5 - 15  CBC     Status: None   Collection Time: 09/14/16 11:11 AM  Result Value Ref Range   WBC 8.9 3.6 - 11.0 K/uL   RBC 3.95 3.80 - 5.20 MIL/uL   Hemoglobin 12.8 12.0 - 16.0 g/dL   HCT 37.0 35.0 - 47.0 %   MCV 93.5 80.0 - 100.0 fL   MCH 32.3 26.0 - 34.0 pg   MCHC 34.6 32.0 - 36.0 g/dL   RDW 12.5 11.5 - 14.5 %   Platelets 349 150 - 440 K/uL  Urinalysis, Complete w Microscopic     Status: Abnormal   Collection Time: 09/14/16 11:11 AM  Result Value Ref Range   Color, Urine YELLOW (A) YELLOW   APPearance HAZY (A) CLEAR   Specific Gravity, Urine 1.029 1.005 - 1.030   pH 5.0 5.0 - 8.0   Glucose, UA >=500 (A) NEGATIVE mg/dL   Hgb urine dipstick NEGATIVE NEGATIVE   Bilirubin Urine NEGATIVE NEGATIVE   Ketones, ur 5 (A) NEGATIVE mg/dL    Protein, ur 30 (A) NEGATIVE mg/dL   Nitrite NEGATIVE NEGATIVE   Leukocytes, UA NEGATIVE NEGATIVE   RBC / HPF 0-5 0 - 5 RBC/hpf   WBC, UA 0-5 0 - 5 WBC/hpf  Bacteria, UA NONE SEEN NONE SEEN   Squamous Epithelial / LPF 0-5 (A) NONE SEEN   Mucus PRESENT   APTT     Status: None   Collection Time: 09/14/16  4:33 PM  Result Value Ref Range   aPTT 26 24 - 36 seconds  Protime-INR     Status: None   Collection Time: 09/14/16  4:33 PM  Result Value Ref Range   Prothrombin Time 13.6 11.4 - 15.2 seconds   INR 1.05   Antithrombin III     Status: None   Collection Time: 09/14/16 10:57 PM  Result Value Ref Range   AntiThromb III Func 88 75 - 120 %    Comment: Performed at [facility] [address], [place], [place] [address]  Protein C activity     Status: None   Collection Time: 09/14/16 10:57 PM  Result Value Ref Range   Protein C Activity 139 73 - 180 %    Comment: (NOTE) Performed At: [facility] [address] [place], [place] [id number] Lindon Romp MD GY:[id number]   Protein C, total     Status: None   Collection Time: 09/14/16 10:57 PM  Result Value Ref Range   Protein C, Total 109 60 - 150 %    Comment: (NOTE) Performed At: [facility] [place], [place] [id number] Lindon Romp MD OY:[id number]   Protein S activity     Status: Abnormal   Collection Time: 09/14/16 10:57 PM  Result Value Ref Range   Protein S Activity 56 (L) 63 - 140 %    Comment: (NOTE) A deficiency of protein S (PS), either congenital or acquired, increases the risk of thromboembolism. PS activity levels may be falsely low in individuals with APCR/Factor V Leiden. Consider performing free protein S antigen in those with APCR/Factor V Leiden before making a diagnosis of protein S deficiency. Acquired PS deficiency is more common than congenital deficiency. PS values decrease with normal pregnancy, and are also dependent on age, sex and hormone status. PS  values tend to be lower in a younger age group and lower in women than in men. Levels may be decreased in pre-menopausal women on oral contraceptive agents. Acquired deficiency can occur as a result of vitamin K deficiency or antagonism, severe hepatic disorders, (hepatitis, cirrhosis, etc.), nephrotic syndrome, inflammatory bowel disease, certain chemotherapeutic agents, L-asparaginse therapy, sepsis, disseminated intravascular coagulation (DIC) and acute thrombosis. Levels may be decreased in  patients with polycythemia vera, sickle cell disease and essential thrombocythemia. Repeat evaluation on a new plasma sample to confirm or refute this result should be considered, after ruling out acquired causes, depending on the clinical scenario. Performed At: [facility] [place], [place] [id number] Lindon Romp MD [id number]   Protein S, total     Status: None   Collection Time: 09/14/16 10:57 PM  Result Value Ref Range   Protein S Ag, Total 66 60 - 150 %    Comment: (NOTE) This test was developed and its performance characteristics determined by [facility]. It has not been cleared or approved by the Food and Drug Administration. Performed At: [facility] [place], [place] [id number] Lindon Romp MD [id number]   Lupus anticoagulant panel     Status: None   Collection Time: 09/14/16 10:57 PM  Result Value Ref Range   PTT Lupus Anticoagulant 35.6 0.0 - 51.9 sec    Comment: (NOTE) Additional testing confirms the presence of  heparin in the test sample. Results obtained after heparin neutralization.    DRVVT 29.7 0.0 - 47.0 sec   Lupus Anticoag Interp Comment:     Comment: (NOTE) No lupus anticoagulant was detected. Performed At: [facility] [place], [place] [id number] Lindon Romp MD IW:[id number]   Beta-2-glycoprotein i abs, IgG/M/A     Status: None   Collection Time: 09/14/16 10:57 PM  Result  Value Ref Range   Beta-2 Glyco I IgG <9 0 - 20 GPI IgG units    Comment: (NOTE) The reference interval reflects a 3SD or 99th percentile interval, which is thought to represent a potentially clinically significant result in accordance with the International Consensus Statement on the classification criteria for definitive antiphospholipid syndrome (APS). J Thromb Haem 2006;4:295-306.    Beta-2-Glycoprotein I IgM <9 0 - 32 GPI IgM units    Comment: (NOTE) The reference interval reflects a 3SD or 99th percentile interval, which is thought to represent a potentially clinically significant result in accordance with the International Consensus Statement on the classification criteria for definitive antiphospholipid syndrome (APS). J Thromb Haem 2006;4:295-306. Performed At: [facility] [place], [place] [id number] Lindon Romp MD AL:[id number]    Beta-2-Glycoprotein I IgA <9 0 - 25 GPI IgA units    Comment: (NOTE) The reference interval reflects a 3SD or 99th percentile interval, which is thought to represent a potentially clinically significant result in accordance with the International Consensus Statement on the classification criteria for definitive antiphospholipid syndrome (APS). J Thromb Haem 2006;4:295-306.   Homocysteine, serum     Status: None   Collection Time: 09/14/16 10:57 PM  Result Value Ref Range   Homocysteine 5.7 0.0 - 15.0 umol/L    Comment: (NOTE) Performed At: [facility] [place], [place] [id number] Lindon Romp MD AS:[id number]   Factor 5 leiden     Status: None   Collection Time: 09/14/16 10:57 PM  Result Value Ref Range   Recommendations-F5LEID: Comment     Comment: (NOTE) Result:  Negative (no mutation found) Factor V Leiden is a specific mutation (R506Q) in the factor V gene that is associated with an increased risk of venous thrombosis. Factor V Leiden is more resistant to inactivation by  activated protein C.  As a result, factor V persists in the circulation leading to a mild hyper- coagulable state.  The Leiden mutation accounts for 90% - 95% of APC resistance.  Factor V Leiden has been reported in patients with deep vein thrombosis, pulmonary embolus, central retinal vein occlusion, cerebral sinus thrombosis and hepatic vein thrombosis. Other risk factors to be considered in the workup for venous thrombosis include the G20210A mutation in the factor II (prothrombin) gene, protein S and C deficiency, and antithrombin deficiencies. Anticardiolipin antibody and lupus anticoagulant analysis may be appropriate for certain patients, as well as homocysteine levels. Contact your local [facility] for information on how to order additi onal testing if desired. **Genetic counselors are available for health care providers to**  discuss results at [phone]). Methodology: DNA analysis of the Factor V gene was performed by allele-specific PCR. The diagnostic sensitivity and specificity is >99% for both. Molecular-based testing is highly accurate, but as in any laboratory test, diagnostic errors may occur. All test results must be combined with clinical information for the most accurate interpretation. This test was developed and its performance characteristics determined by [facility]. It has not been cleared or approved by the Food and Drug Administration.  References: Voelkerding K (1996).  Clin Lab Med [phone]. Allison Quarry, PhD, [facility] Ruben Reason, PhD, [facility] Annetta Maw, M.S., PhD, [facility] Alfredo Bach, PhD, [facility] Norva Riffle, PhD, [facility] Earlean Polka PhD, [facility] Performed At: [facility] [place], [place] [id number] Nechama Guard MD [id number]   Prothrombin gene mutation     Status: None   Collection Time: 09/14/16 10:57 PM  Result Value Ref Range   Recommendations-PTGENE: Comment     Comment:  (NOTE) NEGATIVE No mutation identified. Comment: A point mutation (G20210A) in the factor II (prothrombin) gene is the second most common cause of inherited thrombophilia. The incidence of this mutation in the [place] Caucasian population is about 2% and in the [place] American population it is approximately 0.5%. This mutation is rare in the [place] and Native American population. Being heterozygous for a prothrombin mutation increases the risk for developing venous thrombosis about 2 to 3 times above the general population risk. Being homozygous for the prothrombin gene mutation increases the relative risk for venous thrombosis further, although it is not yet known how much further the risk is increased. In women heterozygous for the prothrombin gene mutation, the use of estrogen containing oral contraceptives increases the relative risk of venous thrombosis about 16 times and the risk of developing cerebral thrombosis is also significantly increased. In pregnancy the pr othrombin gene mutation increases risk for venous thrombosis and may increase risk for stillbirth, placental abruption, pre-eclampsia and fetal growth restriction. If the patient possesses two or more congenital or acquired thrombophilic risk factors, the risk for thrombosis may rise to more than the sum of the risk ratios for the individual mutations. This assay detects only the prothrombin G20210A mutation and does not measure genetic abnormalities elsewhere in the genome. Other thrombotic risk factors may be pursued through systematic clinical laboratory analysis. These factors include the R506Q (Leiden) mutation in the Factor V gene, plasma homocysteine levels, as well as testing for deficiencies of antithrombin III, protein C and protein S. Genetic Counselors are available for health care providers to discuss results at [phone] [phone]). Methodology: DNA analysis of the Factor II gene was performed by  PCR amplification followed by restriction analysis. The di agnostic sensitivity is >99% for both. All the tests must be combined with clinical information for the most accurate interpretation. Molecular-based testing is highly accurate, but as in any laboratory test, diagnostic errors may occur. This test was developed and its performance characteristics determined by [facility]. It has not been cleared or approved by the Food and Drug Administration. Poort SR, et al. Blood. 1996; 24:2683-4196. Varga EA. Circulation. 2004; 222:L79-G92. Mervin Hack, et [place]; 19:700-703. Allison Quarry, PhD, [facility] Ruben Reason, PhD, [facility] Annetta Maw, M.S., PhD, [facility] Alfredo Bach, PhD, [facility] Norva Riffle, PhD, [facility] Earlean Polka, PhD, [facility] Performed At: [facility] [address] [place], [place] [id number] Nechama Guard MD [id number]   Cardiolipin antibodies, IgG, IgM, IgA     Status: None   Collection Time: 09/14/16 10:57 PM  Result Value Ref Range   Anticardiolipin IgG <9 0 - 14 GPL U/mL    Comment: (NOTE)                          Negative:              <15  Indeterminate:     15 - 20                          Low-Med Positive: >20 - 80                          High Positive:         >80    Anticardiolipin IgM <9 0 - 12 MPL U/mL    Comment: (NOTE)                          Negative:              <13                          Indeterminate:     13 - 20                          Low-Med Positive: >20 - 80                          High Positive:         >80    Anticardiolipin IgA <9 0 - 11 APL U/mL    Comment: (NOTE)                          Negative:              <12                          Indeterminate:     12 - 20                          Low-Med Positive: >20 - 80                          High Positive:         >80 Performed At: [facility] [address] [place], [place] [id number] Lindon Romp MD [id number]   HIV antibody (Routine Testing)     Status: None   Collection Time: 09/14/16 10:57 PM  Result Value Ref Range   HIV Screen 4th Generation wRfx Non Reactive Non Reactive    Comment: (NOTE) Performed At: [facility] [place], [place] [id number] Lindon Romp MD [id number]   Heparin level (unfractionated)     Status: None   Collection Time: 09/14/16 10:57 PM  Result Value Ref Range   Heparin Unfractionated 0.53 0.30 - 0.70 IU/mL    Comment:        IF HEPARIN RESULTS ARE BELOW EXPECTED VALUES, AND PATIENT DOSAGE HAS BEEN CONFIRMED, SUGGEST FOLLOW UP TESTING OF ANTITHROMBIN III LEVELS.   Basic metabolic panel     Status: Abnormal   Collection Time: 09/15/16  4:06 AM  Result Value Ref Range   Sodium 137 135 - 145 mmol/L   Potassium 3.9 3.5 - 5.1 mmol/L   Chloride 108 101 - 111 mmol/L   CO2 25 22 - 32 mmol/L   Glucose, Bld 111 (H) 65 - 99 mg/dL   BUN 10 6 - 20 mg/dL   Creatinine, Ser 0.77  0.44 - 1.00 mg/dL   Calcium 8.4 (L) 8.9 - 10.3 mg/dL   GFR calc non Af Amer >60 >60 mL/min   GFR calc Af Amer >60 >60 mL/min    Comment: (NOTE) The eGFR has been calculated using the CKD EPI equation. This calculation has not been validated in all clinical situations. eGFR's persistently <60 mL/min signify possible Chronic Kidney Disease.    Anion gap 4 (L) 5 - 15  CBC     Status: Abnormal   Collection Time: 09/15/16  4:06 AM  Result Value Ref Range   WBC 8.4 3.6 - 11.0 K/uL   RBC 3.71 (L) 3.80 - 5.20 MIL/uL   Hemoglobin 11.9 (L) 12.0 - 16.0 g/dL   HCT 34.8 (L) 35.0 - 47.0 %   MCV 93.8 80.0 - 100.0 fL   MCH 32.0 26.0 - 34.0 pg   MCHC 34.1 32.0 - 36.0 g/dL   RDW 12.4 11.5 - 14.5 %   Platelets 275 150 - 440 K/uL  Heparin level (unfractionated)     Status: None   Collection Time: 09/15/16  4:06 AM  Result Value Ref Range   Heparin Unfractionated 0.36 0.30 - 0.70 IU/mL    Comment:        IF HEPARIN RESULTS ARE BELOW EXPECTED  VALUES, AND PATIENT DOSAGE HAS BEEN CONFIRMED, SUGGEST FOLLOW UP TESTING OF ANTITHROMBIN III LEVELS.   ECHOCARDIOGRAM COMPLETE     Status: None   Collection Time: 09/15/16  2:39 PM  Result Value Ref Range   Weight 2,416 oz   Height 65 in   BP 116/70 mmHg  Pregnancy, urine     Status: None   Collection Time: 09/15/16  4:38 PM  Result Value Ref Range   Preg Test, Ur NEGATIVE NEGATIVE  Heparin level (unfractionated)     Status: None   Collection Time: 09/16/16  4:07 AM  Result Value Ref Range   Heparin Unfractionated 0.70 0.30 - 0.70 IU/mL    Comment:        IF HEPARIN RESULTS ARE BELOW EXPECTED VALUES, AND PATIENT DOSAGE HAS BEEN CONFIRMED, SUGGEST FOLLOW UP TESTING OF ANTITHROMBIN III LEVELS.   CBC     Status: Abnormal   Collection Time: 09/16/16  4:07 AM  Result Value Ref Range   WBC 8.5 3.6 - 11.0 K/uL   RBC 3.19 (L) 3.80 - 5.20 MIL/uL   Hemoglobin 10.3 (L) 12.0 - 16.0 g/dL   HCT 29.8 (L) 35.0 - 47.0 %   MCV 93.2 80.0 - 100.0 fL   MCH 32.2 26.0 - 34.0 pg   MCHC 34.5 32.0 - 36.0 g/dL   RDW 12.5 11.5 - 14.5 %   Platelets 206 150 - 440 K/uL  Basic metabolic panel     Status: Abnormal   Collection Time: 09/16/16  4:07 AM  Result Value Ref Range   Sodium 138 135 - 145 mmol/L   Potassium 3.6 3.5 - 5.1 mmol/L   Chloride 110 101 - 111 mmol/L   CO2 25 22 - 32 mmol/L   Glucose, Bld 103 (H) 65 - 99 mg/dL   BUN 7 6 - 20 mg/dL   Creatinine, Ser 0.69 0.44 - 1.00 mg/dL   Calcium 7.9 (L) 8.9 - 10.3 mg/dL   GFR calc non Af Amer >60 >60 mL/min   GFR calc Af Amer >60 >60 mL/min    Comment: (NOTE) The eGFR has been calculated using the CKD EPI equation. This calculation has not been validated in all clinical situations.  eGFR's persistently <60 mL/min signify possible Chronic Kidney Disease.    Anion gap 3 (L) 5 - 15    Radiology Mm Diag Breast Tomo Bilateral  Result Date: 11/11/2016 CLINICAL DATA:  Patient describes diffuse bilateral intermittent breast pain since recent  DVT. Patient initially felt a knot in the right breast but this has resolved. Patient denies palpable lump today. EXAM: 2D DIGITAL DIAGNOSTIC BILATERAL MAMMOGRAM WITH CAD AND ADJUNCT TOMO COMPARISON:  Previous exam(s). ACR Breast Density Category b: There are scattered areas of fibroglandular density. FINDINGS: There are no dominant masses, suspicious calcifications or secondary signs of malignancy within either breast. Mammographic images were processed with CAD. IMPRESSION: No evidence of malignancy within either breast. RECOMMENDATION: 1.  Screening mammogram in one year.(Code:[id number]) 2. Benign causes of breast pain, and possible remedies, were discussed with the patient. Patient was encouraged to follow-up with referring physician if pain became localized and persistent or if a palpable lump/mass developed. I have discussed the findings and recommendations with the patient. Results were also provided in writing at the conclusion of the visit. If applicable, a reminder letter will be sent to the patient regarding the next appointment. BI-RADS CATEGORY  1: Negative. Electronically Signed   By: Franki Cabot M.D.   On: 11/11/2016 14:31   Mm Outside Films Mammo  Result Date: 11/08/2016 This examination belongs to an outside facility and is stored here for comparison purposes only.  Contact the originating outside institution for any associated report or interpretation.     Assessment/Plan  Hypertension blood pressure control important in reducing the progression of atherosclerotic disease. On appropriate oral medications.   S/P IVC filter This was placed during her percutaneous thrombectomy and thrombolytic therapy to avoid pulmonary embolus.  This can now be removed and will be scheduled at her can  DVT (deep venous thrombosis) (HCC) Her duplex today shows basically complete resolution of her DVT bilaterally. At this point, this is a dramatic improvement and likely significantly improved by  her percutaneous intervention.  She should still get a full 6 months of anticoagulation.  Her filter can be removed.  She can wear compression stockings and elevate her legs to avoid postphlebitic symptoms, although at current she is basically asymptomatic.    Leotis Pain, MD  11/16/2016 4:49 PM    This note was created with Dragon medical transcription system.  Any errors from dictation are purely unintentional

## 2016-11-16 NOTE — Assessment & Plan Note (Signed)
blood pressure control important in reducing the progression of atherosclerotic disease. On appropriate oral medications.  

## 2016-11-16 NOTE — Assessment & Plan Note (Signed)
This was placed during her percutaneous thrombectomy and thrombolytic therapy to avoid pulmonary embolus.  This can now be removed and will be scheduled at her can

## 2016-11-16 NOTE — Patient Instructions (Signed)
Deep Vein Thrombosis A deep vein thrombosis (DVT) is a blood clot (thrombus) that usually occurs in a deep, larger vein of the lower leg or the pelvis, or in an upper extremity such as the arm. These are dangerous and can lead to serious and even life-threatening complications if the clot travels to the lungs. A DVT can damage the valves in your leg veins so that instead of flowing upward, the blood pools in the lower leg. This is called post-thrombotic syndrome, and it can result in pain, swelling, discoloration, and sores on the leg. What are the causes? A DVT is caused by the formation of a blood clot in your leg, pelvis, or arm. Usually, several things contribute to the formation of blood clots. A clot may develop when:  Your blood flow slows down.  Your vein becomes damaged in some way.  You have a condition that makes your blood clot more easily.  What increases the risk? A DVT is more likely to develop in:  People who are older, especially over 60 years of age.  People who are overweight (obese).  People who sit or lie still for a long time, such as during long-distance travel (over 4 hours), bed rest, hospitalization, or during recovery from certain medical conditions like a stroke.  People who do not engage in much physical activity (sedentary lifestyle).  People who have chronic breathing disorders.  People who have a personal or family history of blood clots or blood clotting disease.  People who have peripheral vascular disease (PVD), diabetes, or some types of cancer.  People who have heart disease, especially if the person had a recent heart attack or has congestive heart failure.  People who have neurological diseases that affect the legs (leg paresis).  People who have had a traumatic injury, such as breaking a hip or leg.  People who have recently had major or lengthy surgery, especially on the hip, knee, or abdomen.  People who have had a central line placed  inside a large vein.  People who take medicines that contain the hormone estrogen. These include birth control pills and hormone replacement therapy.  Pregnancy or during childbirth or the postpartum period.  Long plane flights (over 8 hours).  What are the signs or symptoms?  Symptoms of a DVT can include:  Swelling of your leg or arm, especially if one side is much worse.  Warmth and redness of your leg or arm, especially if one side is much worse.  Pain in your arm or leg. If the clot is in your leg, symptoms may be more noticeable or worse when you stand or walk.  A feeling of pins and needles, if the clot is in the arm.  The symptoms of a DVT that has traveled to the lungs (pulmonary embolism, PE) usually start suddenly and include:  Shortness of breath while active or at rest.  Coughing or coughing up blood or blood-tinged mucus.  Chest pain that is often worse with deep breaths.  Rapid or irregular heartbeat.  Feeling light-headed or dizzy.  Fainting.  Feeling anxious.  Sweating.  There may also be pain and swelling in a leg if that is where the blood clot started. These symptoms may represent a serious problem that is an emergency. Do not wait to see if the symptoms will go away. Get medical help right away. Call your local emergency services (911 in the [place] Do not drive yourself to the hospital. How is this diagnosed? Your health   care provider will take a medical history and perform a physical exam. You may also have other tests, including:  Blood tests to assess the clotting properties of your blood.  Imaging tests, such as CT, ultrasound, MRI, X-ray, and other tests to see if you have clots anywhere in your body.  How is this treated? After a DVT is identified, it can be treated. The type of treatment that you receive depends on many factors, such as the cause of your DVT, your risk for bleeding or developing more clots, and other medical conditions that  you have. Sometimes, a combination of treatments is necessary. Treatment options may be combined and include:  Monitoring the blood clot with ultrasound.  Taking medicines by mouth, such as newer blood thinners (anticoagulants), thrombolytics, or warfarin.  Taking anticoagulant medicine by injection or through an IV tube.  Wearing compression stockings or using different types ofdevices.  Surgery (rare) to remove the blood clot or to place a filter in your abdomen to stop the blood clot from traveling to your lungs.  Treatments for a DVT are often divided into immediate treatment and long-term treatment (up to 3 months after DVT). You can work with your health care provider to choose the treatment program that is best for you. Follow these instructions at home: If you are taking a newer oral anticoagulant:  Take the medicine every single day at the same time each day.  Understand what foods and drugs interact with this medicine.  Understand that there are no regular blood tests required when using this medicine.  Understand the side effects of this medicine, including excessive bruising or bleeding. Ask your health care provider or pharmacist about other possible side effects. If you are taking warfarin:  Understand how to take warfarin and know which foods can affect how warfarin works in your body.  Understand that it is dangerous to take too much or too little warfarin. Too much warfarin increases the risk of bleeding. Too little warfarin continues to allow the risk for blood clots.  Follow your PT and INR blood testing schedule. The PT and INR results allow your health care provider to adjust your dose of warfarin. It is very important that you have your PT and INR tested as often as told by your health care provider.  Avoid major changes in your diet, or tell your health care provider before you change your diet. Arrange a visit with a registered dietitian to answer your  questions. Many foods, especially foods that are high in vitamin K, can interfere with warfarin and affect the PT and INR results. Eat a consistent amount of foods that are high in vitamin K, such as: ? Spinach, kale, broccoli, cabbage, collard greens, turnip greens, Brussels sprouts, peas, cauliflower, seaweed, and parsley. ? Beef liver and pork liver. ? Green tea. ? Soybean oil.  Tell your health care provider about any and all medicines, vitamins, and supplements that you take, including aspirin and other over-the-counter anti-inflammatory medicines. Be especially cautious with aspirin and anti-inflammatory medicines. Do not take those before you ask your health care provider if it is safe to do so. This is important because many medicines can interfere with warfarin and affect the PT and INR results.  Do not start or stop taking any over-the-counter or prescription medicine unless your health care provider or pharmacist tells you to do so. If you take warfarin, you will also need to do these things:  Hold pressure over cuts for longer than   usual.  Tell your dentist and other health care providers that you are taking warfarin before you have any procedures in which bleeding may occur.  Avoid alcohol or drink very small amounts. Tell your health care provider if you change your alcohol intake.  Do not use tobacco products, including cigarettes, chewing tobacco, and e-cigarettes. If you need help quitting, ask your health care provider.  Avoid contact sports.  General instructions  Take over-the-counter and prescription medicines only as told by your health care provider. Anticoagulant medicines can have side effects, including easy bruising and difficulty stopping bleeding. If you are prescribed an anticoagulant, you will also need to do these things: ? Hold pressure over cuts for longer than usual. ? Tell your dentist and other health care providers that you are taking anticoagulants  before you have any procedures in which bleeding may occur. ? Avoid contact sports.  Wear a medical alert bracelet or carry a medical alert card that says you have had a PE.  Ask your health care provider how soon you can go back to your normal activities. Stay active to prevent new blood clots from forming.  Make sure to exercise while traveling or when you have been sitting or standing for a long period of time. It is very important to exercise. Exercise your legs by walking or by tightening and relaxing your leg muscles often. Take frequent walks.  Wear compression stockings as told by your health care provider to help prevent more blood clots from forming.  Do not use tobacco products, including cigarettes, chewing tobacco, and e-cigarettes. If you need help quitting, ask your health care provider.  Keep all follow-up appointments with your health care provider. This is important. How is this prevented? Take these actions to decrease your risk of developing another DVT:  Exercise regularly. For at least 30 minutes every day, engage in: ? Activity that involves moving your arms and legs. ? Activity that encourages good blood flow through your body by increasing your heart rate.  Exercise your arms and legs every hour during long-distance travel (over 4 hours). Drink plenty of water and avoid drinking alcohol while traveling.  Avoid sitting or lying in bed for long periods of time without moving your legs.  Maintain a weight that is appropriate for your height. Ask your health care provider what weight is healthy for you.  If you are a woman who is over 35 years of age, avoid unnecessary use of medicines that contain estrogen. These include birth control pills.  Do not smoke, especially if you take estrogen medicines. If you need help quitting, ask your health care provider.  If you are hospitalized, prevention measures may include:  Early walking after surgery, as soon as your  health care provider says that it is safe.  Receiving anticoagulants to prevent blood clots.If you cannot take anticoagulants, other options may be available, such as wearing compression stockings or using different types of devices.  Get help right away if:  You have new or increased pain, swelling, or redness in an arm or leg.  You have numbness or tingling in an arm or leg.  You have shortness of breath while active or at rest.  You have chest pain.  You have a rapid or irregular heartbeat.  You feel light-headed or dizzy.  You cough up blood.  You notice blood in your vomit, bowel movement, or urine. These symptoms may represent a serious problem that is an emergency. Do not wait to see   if the symptoms will go away. Get medical help right away. Call your local emergency services (911 in the [place] Do not drive yourself to the hospital. This information is not intended to replace advice given to you by your health care provider. Make sure you discuss any questions you have with your health care provider. Document Released: 01/04/2005 Document Revised: 06/12/2015 Document Reviewed: 05/01/2014 Elsevier Interactive Patient Education  2017 [employer]  

## 2016-11-18 ENCOUNTER — Other Ambulatory Visit (INDEPENDENT_AMBULATORY_CARE_PROVIDER_SITE_OTHER): Payer: Self-pay | Admitting: Vascular Surgery

## 2016-11-21 MED ORDER — CEFAZOLIN SODIUM-DEXTROSE 2-4 GM/100ML-% IV SOLN: 2.0000 g | Freq: Once | INTRAVENOUS | Status: DC

## 2016-11-22 ENCOUNTER — Encounter
Admission: RE | Disposition: A | Discharge status: Home / Self Care | Payer: Self-pay | Source: Ambulatory Visit | Attending: Vascular Surgery

## 2016-11-22 ENCOUNTER — Encounter: Payer: Self-pay | Admitting: *Deleted

## 2016-11-22 ENCOUNTER — Ambulatory Visit
Admission: RE | Admit: 2016-11-22 | Discharge: 2016-11-22 | Disposition: A | Discharge status: Home / Self Care | Payer: Managed Care, Other (non HMO) | Source: Ambulatory Visit | Attending: Vascular Surgery | Admitting: Vascular Surgery

## 2016-11-22 DIAGNOSIS — I82409 Acute embolism and thrombosis of unspecified deep veins of unspecified lower extremity: Secondary | ICD-10-CM | POA: Diagnosis not present

## 2016-11-22 DIAGNOSIS — Z807 Family history of other malignant neoplasms of lymphoid, hematopoietic and related tissues: Secondary | ICD-10-CM | POA: Insufficient documentation

## 2016-11-22 DIAGNOSIS — Z452 Encounter for adjustment and management of vascular access device: Secondary | ICD-10-CM | POA: Insufficient documentation

## 2016-11-22 DIAGNOSIS — Z888 Allergy status to other drugs, medicaments and biological substances status: Secondary | ICD-10-CM | POA: Diagnosis not present

## 2016-11-22 DIAGNOSIS — Z7902 Long term (current) use of antithrombotics/antiplatelets: Secondary | ICD-10-CM | POA: Diagnosis not present

## 2016-11-22 DIAGNOSIS — Z8249 Family history of ischemic heart disease and other diseases of the circulatory system: Secondary | ICD-10-CM | POA: Diagnosis not present

## 2016-11-22 DIAGNOSIS — I10 Essential (primary) hypertension: Secondary | ICD-10-CM | POA: Insufficient documentation

## 2016-11-22 DIAGNOSIS — I82403 Acute embolism and thrombosis of unspecified deep veins of lower extremity, bilateral: Secondary | ICD-10-CM | POA: Diagnosis not present

## 2016-11-22 HISTORY — PX: IVC FILTER REMOVAL: CATH118246

## 2016-11-22 LAB — PREGNANCY, URINE: PREG TEST UR: NEGATIVE

## 2016-11-22 SURGERY — IVC FILTER REMOVAL
Anesthesia: Moderate Sedation

## 2016-11-22 MED ORDER — FENTANYL CITRATE (PF) 100 MCG/2ML IJ SOLN
INTRAMUSCULAR | Status: AC
  Filled 2016-11-22: qty 2

## 2016-11-22 MED ORDER — FENTANYL CITRATE (PF) 100 MCG/2ML IJ SOLN
INTRAMUSCULAR | Status: DC | PRN
  Administered 2016-11-22 (×2): 50 ug via INTRAVENOUS

## 2016-11-22 MED ORDER — SODIUM CHLORIDE 0.9 % IV SOLN
INTRAVENOUS | Status: DC
  Administered 2016-11-22: 09:00:00 via INTRAVENOUS

## 2016-11-22 MED ORDER — LIDOCAINE-EPINEPHRINE (PF) 1 %-1:200000 IJ SOLN
INTRAMUSCULAR | Status: AC
  Filled 2016-11-22: qty 30

## 2016-11-22 MED ORDER — HYDROMORPHONE HCL 1 MG/ML IJ SOLN: 1.0000 mg | Freq: Once | INTRAMUSCULAR | Status: DC | PRN

## 2016-11-22 MED ORDER — MIDAZOLAM HCL 2 MG/2ML IJ SOLN
INTRAMUSCULAR | Status: DC | PRN
Start: 2016-11-22 — End: 2016-11-22
  Administered 2016-11-22 (×2): 2 mg via INTRAVENOUS

## 2016-11-22 MED ORDER — MIDAZOLAM HCL 2 MG/2ML IJ SOLN
INTRAMUSCULAR | Status: AC
  Filled 2016-11-22: qty 4

## 2016-11-22 MED ORDER — APIXABAN 5 MG PO TABS: 5.0000 mg | ORAL_TABLET | Freq: Two times a day (BID) | ORAL | 3 refills | Status: DC

## 2016-11-22 MED ORDER — ONDANSETRON HCL 4 MG/2ML IJ SOLN
4.0000 mg | Freq: Four times a day (QID) | INTRAMUSCULAR | Status: DC | PRN
Start: 2016-11-22 — End: 2016-11-22

## 2016-11-22 MED ORDER — HEPARIN (PORCINE) IN NACL 2-0.9 UNIT/ML-% IJ SOLN
INTRAMUSCULAR | Status: AC
  Filled 2016-11-22: qty 500

## 2016-11-22 MED ORDER — IOPAMIDOL (ISOVUE-300) INJECTION 61%
INTRAVENOUS | Status: DC | PRN
Start: 2016-11-22 — End: 2016-11-22
  Administered 2016-11-22: 15 mL via INTRAVENOUS

## 2016-11-22 SURGICAL SUPPLY — 3 items
PACK ANGIOGRAPHY (CUSTOM PROCEDURE TRAY) ×3 IMPLANT
SET VENACAVA FILTER RETRIEVAL (MISCELLANEOUS) ×3 IMPLANT
WIRE J 3MM .035X145CM (WIRE) ×3 IMPLANT

## 2016-11-22 NOTE — Discharge Instructions (Signed)
Incision Care, Adult °An incision is a cut that a doctor makes in your skin for surgery (for a procedure). Most times, these cuts are closed after surgery. Your cut from surgery may be closed with stitches (sutures), staples, skin glue, or skin tape (adhesive strips). You may need to return to your doctor to have stitches or staples taken out. This may happen many days or many weeks after your surgery. The cut needs to be well cared for so it does not get infected. °How to care for your cut °Cut care °· Follow instructions from your doctor about how to take care of your cut. Make sure you: °? Wash your hands with soap and water before you change your bandage (dressing). If you cannot use soap and water, use hand sanitizer. °? Change your bandage as told by your doctor. °? Leave stitches, skin glue, or skin tape in place. They may need to stay in place for 2 weeks or longer. If tape strips get loose and curl up, you may trim the loose edges. Do not remove tape strips completely unless your doctor says it is okay. °· Check your cut area every day for signs of infection. Check for: °? More redness, swelling, or pain. °? More fluid or blood. °? Warmth. °? Pus or a bad smell. °· Ask your doctor how to clean the cut. This may include: °? Using mild soap and water. °? Using a clean towel to pat the cut dry after you clean it. °? Putting a cream or ointment on the cut. Do this only as told by your doctor. °? Covering the cut with a clean bandage. °· Ask your doctor when you can leave the cut uncovered. °· Do not take baths, swim, or use a hot tub until your doctor says it is okay. Ask your doctor if you can take showers. You may only be allowed to take sponge baths for bathing. °Medicines °· If you were prescribed an antibiotic medicine, cream, or ointment, take the antibiotic or put it on the cut as told by your doctor. Do not stop taking or putting on the antibiotic even if your condition gets better. °· Take  over-the-counter and prescription medicines only as told by your doctor. °General instructions °· Limit movement around your cut. This helps healing. °? Avoid straining, lifting, or exercise for the first month, or for as long as told by your doctor. °? Follow instructions from your doctor about going back to your normal activities. °? Ask your doctor what activities are safe. °· Protect your cut from the sun when you are outside for the first 6 months, or for as long as told by your doctor. Put on sunscreen around the scar or cover up the scar. °· Keep all follow-up visits as told by your doctor. This is important. °Contact a doctor if: °· Your have more redness, swelling, or pain around the cut. °· You have more fluid or blood coming from the cut. °· Your cut feels warm to the touch. °· You have pus or a bad smell coming from the cut. °· You have a fever or shaking chills. °· You feel sick to your stomach (nauseous) or you throw up (vomit). °· You are dizzy. °· Your stitches or staples come undone. °Get help right away if: °· You have a red streak coming from your cut. °· Your cut bleeds through the bandage and the bleeding does not stop with gentle pressure. °· The edges of your cut open   up and separate. °· You have very bad (severe) pain. °· You have a rash. °· You are confused. °· You pass out (faint). °· You have trouble breathing and you have a fast heartbeat. °This information is not intended to replace advice given to you by your health care provider. Make sure you discuss any questions you have with your health care provider. °Document Released: 03/29/2011 Document Revised: 09/12/2015 Document Reviewed: 09/12/2015 °Elsevier Interactive Patient Education © 2017 [employer] ° °

## 2016-11-22 NOTE — Op Note (Signed)
[facility]   OPERATIVE NOTE    PRE-OPERATIVE DIAGNOSIS:  1. DVT 2. status post IVC filter placement  POST-OPERATIVE DIAGNOSIS: Same as above  PROCEDURE: 1. Ultrasound guidance for vascular access right jugular vein 2. Catheter placement into inferior vena cava from right jugular vein 3. Inferior venacavogram 4. Retrieval of Cook Celect IVC filter  SURGEON: Festus BarrenJason Jennell Janosik, MD  ASSISTANT(S): None  ANESTHESIA: Local with moderate conscious sedation for approximately 15 minutes using 4 mg of Versed and 100 Mcg of Fentanyl  ESTIMATED BLOOD LOSS: 3 cc  CONTRAST:  15 cc  FLUORO TIME: 0.7 minutes  FINDING(S): 1. Patent IVC with no thrombosis within the filter  SPECIMEN(S): IVC filter  INDICATIONS:  Patient is a 46 y.o. female who presents with a previous history of IVC filter placement. Patient has continued anticoagulation and did not have residual recent ultrasound and no longer needs this filter. The patient remains on anticoagulation. Risks and benefits were discussed, and informed consent was obtained.  DESCRIPTION: After obtaining full informed written consent, the patient was brought back to the vascular suite and placed supine upon the table.Moderate conscious sedation was administered during a face to face encounter with the patient throughout the procedure with my supervision of the RN administering medicines and monitoring the patient's vital signs, pulse oximetry, telemetry and mental status throughout from the start of the procedure until the patient was taken to the recovery room.  After obtaining adequate anesthesia, the patient was prepped and draped in the standard fashion. The right jugular vein was visualized with ultrasound and found to be widely patent. It was then accessed under direct ultrasound guidance without difficulty with the Seldinger needle and a permanent image was recorded. A J-wire was placed. After skin nick and  dilatation, the retrieval sheath was placed over the wire and advanced into the inferior vena cava. Inferior vena cava was imaged and found to be widely patent on inferior venacavogram. The filter was straight in its orientation. The retrieval snare was then placed through the sheath and the hook of the filter was snared without difficulty. The sheath was then advanced, and the filter was collapsed and brought into the sheath in its entirety. It was then removed from the body in its entirety. The retrieval sheath was then removed. Pressure was held at the access site and sterile dressing was placed. The patient was taken to the recovery room in stable condition having tolerated the procedure well.  COMPLICATIONS: None  CONDITION: Stable   Festus BarrenJason Hektor Huston 11/22/2016 10:28 AM  This note was created with Dragon Medical transcription system. Any errors in dictation are purely unintentional.

## 2016-11-22 NOTE — H&P (Signed)
[facility] History & Physical Update  The patient was interviewed and re-examined.  The patient's previous History and Physical has been reviewed and is unchanged.  There is no change in the plan of care. We plan to proceed with the scheduled procedure.  Festus BarrenJason Hendry Speas, MD  11/22/2016, 8:11 AM

## 2016-11-23 ENCOUNTER — Encounter: Payer: Self-pay | Admitting: Vascular Surgery

## 2017-01-17 ENCOUNTER — Ambulatory Visit: Payer: Self-pay | Admitting: Emergency Medicine

## 2017-01-17 VITALS — BP 120/70 | HR 87 | Temp 98.2°F | Resp 16

## 2017-01-17 DIAGNOSIS — H6591 Unspecified nonsuppurative otitis media, right ear: Secondary | ICD-10-CM

## 2017-01-17 LAB — POCT RAPID STREP A (OFFICE): Rapid Strep A Screen: NEGATIVE

## 2017-01-17 MED ORDER — AMOXICILLIN 875 MG PO TABS: 875.0000 mg | ORAL_TABLET | Freq: Two times a day (BID) | ORAL | 0 refills | Status: DC

## 2017-01-17 NOTE — Addendum Note (Signed)
Addended by: Catha BrowEACON, MONIQUE T on: 01/17/2017 03:09 PM   Modules accepted: Orders

## 2017-01-17 NOTE — Progress Notes (Signed)
Subjective.  Patient enters with a 4-5 day history of sore throat and discomfort in her right ear. She has not had a fever. She has tried Mucinex with some improvement. She has not had a cough.  Review of systems.   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 She was on birth control pills when this developed. She also has a history of a Schatzki's.  Objective.  Left TM is normal. Right TM is bulging with fluid behind the drum. Nose normal. Throat exam reveals patient has had a tonsillectomy with no redness. Neck supple without adenopathy. Chest clear to auscultation Extremity exam reveals no calf swelling.  Assessment.  Patient has a right otitis media. Strep test was negative  Plan  Amoxicillin 875 twice a day. Zyrtec one a day. Mucinex twice a day. Strep test done.

## 2017-02-22 ENCOUNTER — Ambulatory Visit (INDEPENDENT_AMBULATORY_CARE_PROVIDER_SITE_OTHER): Payer: Managed Care, Other (non HMO) | Admitting: Vascular Surgery

## 2017-02-22 ENCOUNTER — Encounter (INDEPENDENT_AMBULATORY_CARE_PROVIDER_SITE_OTHER): Payer: Self-pay | Admitting: Vascular Surgery

## 2017-02-22 VITALS — BP 134/74 | HR 75 | Resp 17 | Wt 151.0 lb

## 2017-02-22 DIAGNOSIS — I82403 Acute embolism and thrombosis of unspecified deep veins of lower extremity, bilateral: Secondary | ICD-10-CM

## 2017-02-22 DIAGNOSIS — Z95828 Presence of other vascular implants and grafts: Secondary | ICD-10-CM

## 2017-02-22 DIAGNOSIS — I10 Essential (primary) hypertension: Secondary | ICD-10-CM

## 2017-02-22 NOTE — Assessment & Plan Note (Signed)
She is doing well and has tolerated anticoagulation.  With the very extensive nature of her DVT, we discussed continuing anticoagulation for 1 year then switching to aspirin.  I will see her back in about 6 months and at that time we will likely switch her to aspirin.  We discussed that she is at increased risk of recurrent DVT over the general population.  She has had no problems with anticoagulation.

## 2017-02-22 NOTE — Progress Notes (Signed)
MRN : [id number]  Stacy Juarez is a 47 y.o. (08-May-1970) female who presents with chief complaint of  Chief Complaint  Patient presents with  . Follow-up    73month follow up  .  History of Present Illness: Patient returns today in follow up of her DVT.  She had extremely extensive DVT and underwent thrombolytic therapy about 4-5 months ago.  She has subsequently had her filter removed and has done well.  She really does not have any swelling at this time.  She is overall doing fairly well.         Current Outpatient Prescriptions  Medication Sig Dispense Refill  . apixaban (ELIQUIS) 5 MG TABS tablet Take 2 tablets by mouth twice a day for 1 week and then 1 tablet twice a day after that 74 tablet 1  . losartan (COZAAR) 25 MG tablet Take 25 mg by mouth daily.     No current facility-administered medications for this visit.         Past Medical History:  Diagnosis Date  . Hypertension          Past Surgical History:  Procedure Laterality Date  . CHOLECYSTECTOMY    . PERIPHERAL VASCULAR THROMBECTOMY Left 09/15/2016   Procedure: PERIPHERAL VASCULAR THROMBECTOMY, venous, and IVC filter;  Surgeon: Annice Needyew, Jason S, MD;  Location: [facility];  Service: Cardiovascular;  Laterality: Left;  . TONSILLECTOMY      Social History     Social History  Substance Use Topics  . Smoking status: Never Smoker  . Smokeless tobacco: Never Used  . Alcohol use No     Family History      Family History  Problem Relation Age of Onset  . CAD Mother   . Lymphoma Father           Allergies  Allergen Reactions  . Lisinopril Nausea And Vomiting    And diarrhea.     REVIEW OF SYSTEMS (Negative unless checked)  Constitutional: [] Weight loss  [] Fever  [] Chills Cardiac: [] Chest pain   [] Chest pressure   [] Palpitations   [] Shortness of breath when laying flat   [] Shortness of breath at rest   [] Shortness of breath with exertion. Vascular:   [x] Pain in legs with walking   [x] Pain in legs at rest   [] Pain in legs when laying flat   [] Claudication   [] Pain in feet when walking  [] Pain in feet at rest  [] Pain in feet when laying flat   [x] History of DVT   [] Phlebitis   [x] Swelling in legs   [] Varicose veins   [] Non-healing ulcers Pulmonary:   [] Uses home oxygen   [] Productive cough   [] Hemoptysis   [] Wheeze  [] COPD   [] Asthma Neurologic:  [] Dizziness  [] Blackouts   [] Seizures   [] History of stroke   [] History of TIA  [] Aphasia   [] Temporary blindness   [] Dysphagia   [] Weakness or numbness in arms   [] Weakness or numbness in legs Musculoskeletal:  [] Arthritis   [] Joint swelling   [] Joint pain   [] Low back pain Hematologic:  [] Easy bruising  [] Easy bleeding   [] Hypercoagulable state   [] Anemic   Gastrointestinal:  [] Blood in stool   [] Vomiting blood  [] Gastroesophageal reflux/heartburn   [] Abdominal pain Genitourinary:  [] Chronic kidney disease   [] Difficult urination  [] Frequent urination  [] Burning with urination   [] Hematuria Skin:  [] Rashes   [] Ulcers   [] Wounds Psychological:  [] History of anxiety   []  History of major depression.  Physical Examination  BP 134/74 (BP Location: Right Arm)   Pulse 75   Resp 17   Wt 68.5 kg (151 lb)   BMI 25.13 kg/m  Gen:  WD/WN, NAD Head: [place]/AT, No temporalis wasting. Ear/Nose/Throat: Hearing grossly intact, nares w/o erythema or drainage, trachea midline Eyes: Conjunctiva clear. Sclera non-icteric Neck: Supple.  No JVD.  Pulmonary:  Good air movement, no use of accessory muscles.  Cardiac: RRR, normal S1, S2 Vascular:  Vessel Right Left  Radial Palpable Palpable                          PT Palpable Palpable  DP Palpable Palpable   Gastrointestinal: soft, non-tender/non-distended.  Musculoskeletal: M/S 5/5 throughout.  No deformity or atrophy. No edema. Neurologic: Sensation grossly intact in extremities.  Symmetrical.  Speech is fluent.  Psychiatric: Judgment intact, Mood &  affect appropriate for pt's clinical situation. Dermatologic: No rashes or ulcers noted.  No cellulitis or open wounds.      Labs Recent Results (from the past 2160 hour(s))  POCT Rapid Strep A-Office (CPT [phone])     Status: Normal   Collection Time: 01/17/17  3:08 PM  Result Value Ref Range   Rapid Strep A Screen Negative Negative    Radiology No results found.    Assessment/Plan  Hypertension blood pressure control important in reducing the progression of atherosclerotic disease. On appropriate oral medications.    S/P IVC filter Now removed  DVT (deep venous thrombosis) (HCC) She is doing well and has tolerated anticoagulation.  With the very extensive nature of her DVT, we discussed continuing anticoagulation for 1 year then switching to aspirin.  I will see her back in about 6 months and at that time we will likely switch her to aspirin.  We discussed that she is at increased risk of recurrent DVT over the general population.  She has had no problems with anticoagulation.    Festus Barren, MD  02/22/2017 9:18 AM    This note was created with Dragon medical transcription system.  Any errors from dictation are purely unintentional

## 2017-02-22 NOTE — Patient Instructions (Signed)
Deep Vein Thrombosis Deep vein thrombosis (DVT) is a condition in which a blood clot forms in a deep vein, such as a lower leg, thigh, or arm vein. A clot is blood that has thickened into a gel or solid. This condition is dangerous. It can lead to serious and even life-threatening complications if the clot travels to the lungs and causes a blockage (pulmonary embolism). It can also damage veins in the leg. This can result in leg pain, swelling, discoloration, and sores (post-thrombotic syndrome). What are the causes? This condition may be caused by:  A slowdown of blood flow.  Damage to a vein.  A condition that makes blood clot more easily.  What increases the risk? The following factors may make you more likely to develop this condition:  Being overweight.  Being elderly, especially over age 60.  Sitting or lying down for more than four hours.  Lack of physical activity (sedentary lifestyle).  Being pregnant, giving birth, or having recently given birth.  Taking medicines that contain estrogen.  Smoking.  A history of any of the following: ? Blood clots or blood clotting disease. ? Peripheral vascular disease. ? Inflammatory bowel disease. ? Cancer. ? Heart disease. ? Genetic conditions that affect how blood clots. ? Neurological diseases that affect the legs (leg paresis). ? Injury. ? Major or lengthy surgery. ? A central line placed inside a large vein.  What are the signs or symptoms? Symptoms of this condition include:  Swelling, pain, or tenderness in an arm or leg.  Warmth, redness, or discoloration in an arm or leg.  If the clot is in your leg, symptoms may be more noticeable or worse when you stand or walk. Some people do not have any symptoms. How is this diagnosed? This condition is diagnosed with:  A medical history.  A physical exam.  Tests, such as: ? Blood tests. These are done to see how your blood clots. ? Imaging tests. These are done to  check for clots. Tests may include:  Ultrasound.  CT scan.  MRI.  X-ray.  Venogram. For this test, X-rays are taken after a dye is injected into a vein.  How is this treated? Treatment for this condition depends on the cause, your risk for bleeding or developing more clots, and any medical conditions you have. Treatment may include:  Taking blood thinners (also called anticoagulants). These medicines may be taken by mouth, injected under the skin, or injected through an IV tube (catheter). These medicines prevent clots from forming.  Injecting medicine that dissolves blood clots into the affected vein (catheter-directed thrombolysis).  Having surgery. Surgery may be done to: ? Remove the clot. ? Place a filter in a large vein to catch blood clots before they reach the lungs.  Some treatments may be continued for up to six months. Follow these instructions at home: If you are taking an oral blood thinner:  Take the medicine exactly as told by your health care provider. Some blood thinners need to be taken at the same time every day. Do not skip a dose.  Ask your health care provider about what foods and drugs interact with the medicine.  Ask about possible side effects. General instructions  Blood thinners can cause easy bruising and difficulty stopping bleeding. Because of this, if you are taking or were given a blood thinner: ? Hold pressure over cuts for longer than usual. ? Tell your dentist and other health care providers that you are taking blood thinners before   having any procedures that can cause bleeding. ? Avoid contact sports.  Take over-the-counter and prescription medicines only as told by your health care provider.  Return to your normal activities as told by your health care provider. Ask your health care provider what activities are safe for you.  Wear compression stockings if recommended by your health care provider.  Keep all follow-up visits as told by  your health care provider. This is important. How is this prevented? To lower your risk of developing this condition again:  For 30 or more minutes every day, do an activity that: ? Involves moving your arms and legs. ? Increases your heart rate.  When traveling for longer than four hours: ? Exercise your arms and legs every hour. ? Drink plenty of water. ? Avoid drinking alcohol.  Avoid sitting or lying for a long time without moving your legs.  Stay a healthy weight.  If you are a woman who is older than age 35, avoid unnecessary use of medicines that contain estrogen.  Do not use any products that contain nicotine or tobacco, such as cigarettes and e-cigarettes. This is especially important if you take estrogen medicines. If you need help quitting, ask your health care provider.  Contact a health care provider if:  You miss a dose of your blood thinner.  You have nausea, vomiting, or diarrhea that lasts for more than one day.  Your menstrual period is heavier than usual.  You have unusual bruising. Get help right away if:  You have new or increased pain, swelling, or redness in an arm or leg.  You have numbness or tingling in an arm or leg.  You have shortness of breath.  You have chest pain.  You have a rapid or irregular heartbeat.  You feel light-headed or dizzy.  You cough up blood.  There is blood in your vomit, stool, or urine.  You have a serious fall or accident, or you hit your head.  You have a severe headache or confusion.  You have a cut that will not stop bleeding. These symptoms may represent a serious problem that is an emergency. Do not wait to see if the symptoms will go away. Get medical help right away. Call your local emergency services (911 in the [place] Do not drive yourself to the hospital. Summary  DVT is a condition in which a blood clot forms in a deep vein, such as a lower leg, thigh, or arm vein.  Symptoms can include swelling,  warmth, pain, and redness in your leg or arm.  Treatment may include taking blood thinners, injecting medicine that dissolves blood clots,wearing compression stockings, or surgery.  If you are prescribed blood thinners, take them exactly as told. This information is not intended to replace advice given to you by your health care provider. Make sure you discuss any questions you have with your health care provider. Document Released: 01/04/2005 Document Revised: 02/07/2016 Document Reviewed: 02/07/2016 Elsevier Interactive Patient Education  2018 [employer]  

## 2017-02-22 NOTE — Assessment & Plan Note (Signed)
Now removed 

## 2017-09-06 ENCOUNTER — Ambulatory Visit (INDEPENDENT_AMBULATORY_CARE_PROVIDER_SITE_OTHER): Payer: Managed Care, Other (non HMO) | Admitting: Vascular Surgery

## 2017-09-06 ENCOUNTER — Encounter (INDEPENDENT_AMBULATORY_CARE_PROVIDER_SITE_OTHER): Payer: Self-pay | Admitting: Vascular Surgery

## 2017-09-06 VITALS — BP 129/77 | HR 69 | Resp 16 | Ht 66.0 in | Wt 155.0 lb

## 2017-09-06 DIAGNOSIS — I82403 Acute embolism and thrombosis of unspecified deep veins of lower extremity, bilateral: Secondary | ICD-10-CM | POA: Diagnosis not present

## 2017-09-06 DIAGNOSIS — Z95828 Presence of other vascular implants and grafts: Secondary | ICD-10-CM

## 2017-09-06 DIAGNOSIS — I10 Essential (primary) hypertension: Secondary | ICD-10-CM

## 2017-09-06 NOTE — Progress Notes (Signed)
MRN : [id number]  Stacy Juarez is a 47 y.o. (19-Apr-1970) female who presents with chief complaint of  Chief Complaint  Patient presents with  . Follow-up    no studies  .  History of Present Illness: Patient returns today in follow up of her left leg DVT.  She is about 1 year status post diagnosis with thrombectomy and thrombolytic therapy and currently really has no significant pain or swelling in her legs.  She has no complaints today.  She has been on full anticoagulation since that time.  Current Outpatient Medications  Medication Sig Dispense Refill  . apixaban (ELIQUIS) 5 MG TABS tablet Take 1 tablet (5 mg total) 2 (two) times daily by mouth. 30 tablet 3  . Cholecalciferol (VITAMIN D PO) Take 1 capsule by mouth daily.    Marland Kitchen. ibuprofen (ADVIL,MOTRIN) 200 MG tablet Take 400 mg by mouth every 6 (six) hours as needed for headache or moderate pain.    Marland Kitchen. KRILL OIL PO Take 1 tablet by mouth daily.    Marland Kitchen. amoxicillin (AMOXIL) 875 MG tablet Take 1 tablet (875 mg total) by mouth 2 (two) times daily. (Patient not taking: Reported on 09/06/2017) 20 tablet 0  . calcium carbonate (TUMS - DOSED IN MG ELEMENTAL CALCIUM) 500 MG chewable tablet Chew 2 tablets by mouth as needed for indigestion or heartburn.    . losartan (COZAAR) 25 MG tablet Take 12.5 mg by mouth every other day.      No current facility-administered medications for this visit.     Past Medical History:  Diagnosis Date  . Hypertension     Past Surgical History:  Procedure Laterality Date  . CHOLECYSTECTOMY    . IVC FILTER REMOVAL N/A 11/22/2016   Procedure: IVC FILTER REMOVAL;  Surgeon: Annice Needyew, Jason S, MD;  Location: [facility];  Service: Cardiovascular;  Laterality: N/A;  . PERIPHERAL VASCULAR THROMBECTOMY Left 09/15/2016   Procedure: PERIPHERAL VASCULAR THROMBECTOMY, venous, and IVC filter;  Surgeon: Annice Needyew, Jason S, MD;  Location: [facility];  Service: Cardiovascular;  Laterality: Left;  . TONSILLECTOMY       Social History     Social History  Substance Use Topics  . Smoking status: Never Smoker  . Smokeless tobacco: Never Used  . Alcohol use No     Family History      Family History  Problem Relation Age of Onset  . CAD Mother   . Lymphoma Father           Allergies  Allergen Reactions  . Lisinopril Nausea And Vomiting    And diarrhea.     REVIEW OF SYSTEMS(Negative unless checked)  Constitutional: [] Weight loss[] Fever[] Chills Cardiac:[] Chest pain[] Chest pressure[] Palpitations [] Shortness of breath when laying flat [] Shortness of breath at rest [] Shortness of breath with exertion. Vascular: [x] Pain in legs with walking[x] Pain in legsat rest[] Pain in legs when laying flat [] Claudication [] Pain in feet when walking [] Pain in feet at rest [] Pain in feet when laying flat [x] History of DVT [] Phlebitis [x] Swelling in legs [] Varicose veins [] Non-healing ulcers Pulmonary: [] Uses home oxygen [] Productive cough[] Hemoptysis [] Wheeze [] COPD [] Asthma Neurologic: [] Dizziness [] Blackouts [] Seizures [] History of stroke [] History of TIA[] Aphasia [] Temporary blindness[] Dysphagia [] Weaknessor numbness in arms [] Weakness or numbnessin legs Musculoskeletal: [] Arthritis [] Joint swelling [] Joint pain [] Low back pain Hematologic:[] Easy bruising[] Easy bleeding [] Hypercoagulable state [] Anemic  Gastrointestinal:[] Blood in stool[] Vomiting blood[] Gastroesophageal reflux/heartburn[] Abdominal pain Genitourinary: [] Chronic kidney disease [] Difficulturination [] Frequenturination [] Burning with urination[] Hematuria Skin: [] Rashes [] Ulcers [] Wounds Psychological: [] History of anxiety[] History of major depression.    Physical Examination  BP 129/77   Pulse 69   Resp 16   Ht 5\' 6"  (1.676 m)   Wt 155 lb (70.3 kg)   BMI 25.02 kg/m  Gen:  WD/WN, NAD Head:  [place]/AT, No temporalis wasting. Ear/Nose/Throat: Hearing grossly intact, nares w/o erythema or drainage Eyes: Conjunctiva clear. Sclera non-icteric Neck: Supple.  Trachea midline Pulmonary:  Good air movement, no use of accessory muscles.  Cardiac: RRR, no JVD Vascular:  Vessel Right Left  Radial Palpable Palpable                          PT Palpable Palpable  DP Palpable Palpable    Musculoskeletal: M/S 5/5 throughout.  No deformity or atrophy.  No stasis changes or edema. Neurologic: Sensation grossly intact in extremities.  Symmetrical.  Speech is fluent.  Psychiatric: Judgment intact, Mood & affect appropriate for pt's clinical situation. Dermatologic: No rashes or ulcers noted.  No cellulitis or open wounds.       Labs No results found for this or any previous visit (from the past 2160 hour(s)).  Radiology No results found.  Assessment/Plan Hypertension blood pressure control important in reducing the progression of atherosclerotic disease. On appropriate oral medications.  S/P IVC filter Now removed  DVT (deep venous thrombosis) (HCC) The patient is 1 year status post treatment for extensive left leg DVT.  Her leg is doing well.  She can come off a full anticoagulation at this time and go to aspirin therapy.  I will see her back as needed.    Festus BarrenJason Dew, MD  09/06/2017 9:34 AM    This note was created with Dragon medical transcription system.  Any errors from dictation are purely unintentional

## 2017-09-06 NOTE — Assessment & Plan Note (Signed)
The patient is 1 year status post treatment for extensive left leg DVT.  Her leg is doing well.  She can come off a full anticoagulation at this time and go to aspirin therapy.  I will see her back as needed.

## 2017-12-07 DIAGNOSIS — M65332 Trigger finger, left middle finger: Secondary | ICD-10-CM | POA: Insufficient documentation

## 2018-01-04 IMAGING — CT CT ANGIO CHEST
2 of 6 series · 19 of 46 positions shown · IV contrast (APPLIED)
Comparison: None.

CLINICAL DATA: Syncope. Lower extremity DVT. Question pulmonary
embolus.

EXAM:
CT ANGIOGRAPHY CHEST WITH CONTRAST
TECHNIQUE: Multidetector CT imaging of the chest was performed using the
standard protocol during bolus administration of intravenous
contrast. Multiplanar CT image reconstructions and MIPs were
obtained to evaluate the vascular anatomy.
CONTRAST:  75 cc Isovue 370 IV

[Series 6: thins · axial · 0.71mm/px · z∈[-314,-68]mm · 17 of 270 slices shown]
[im 12/270  lung]
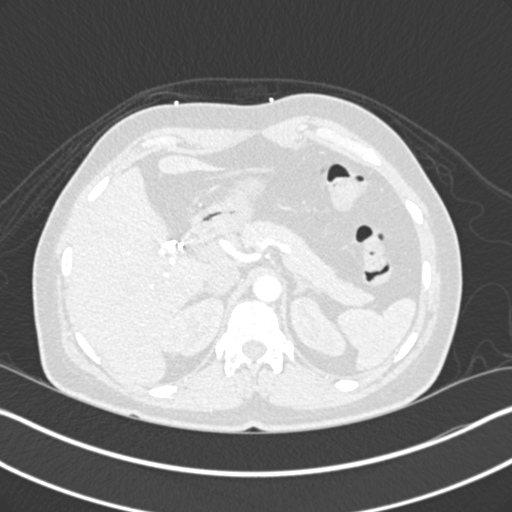
[im 24/270  soft-tissue]
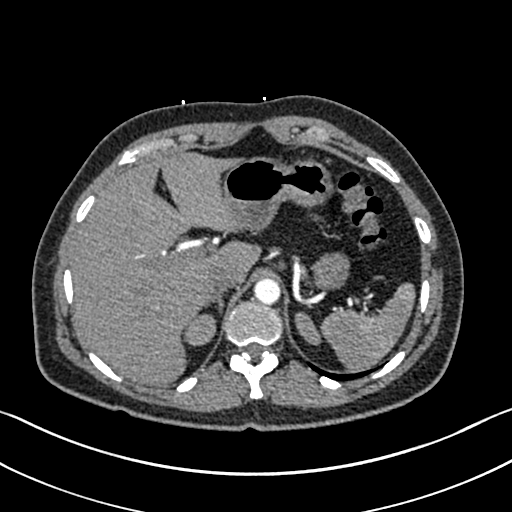
[im 47/270  lung]
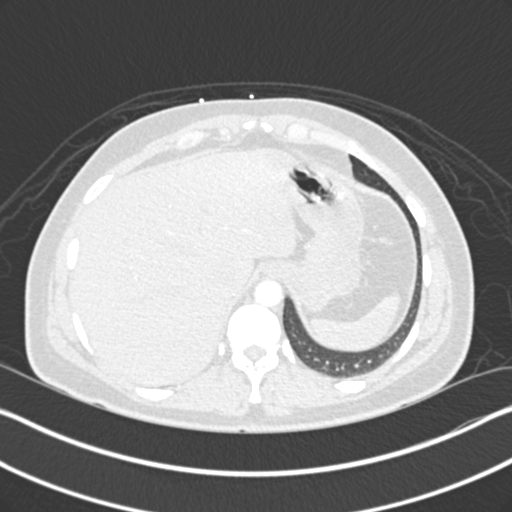
[im 59/270  soft-tissue]
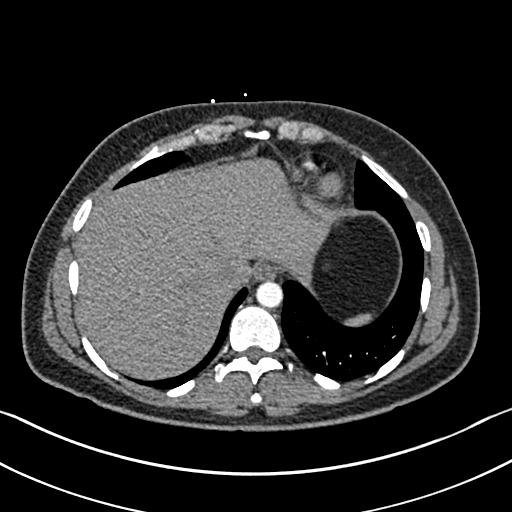
[im 71/270  lung]
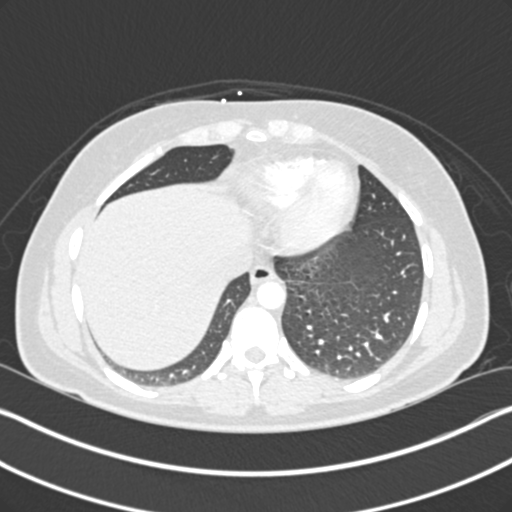
[im 94/270  soft-tissue]
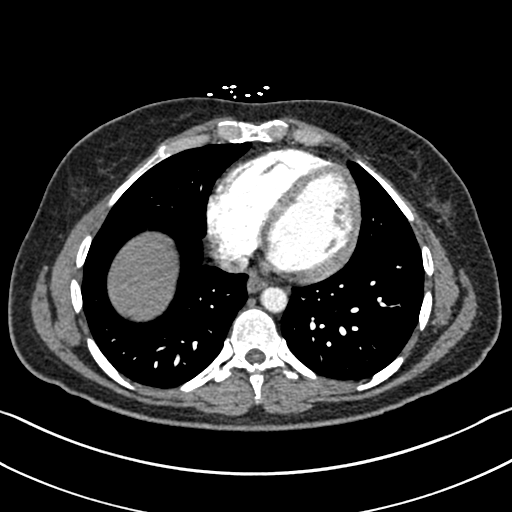
[im 106/270  lung]
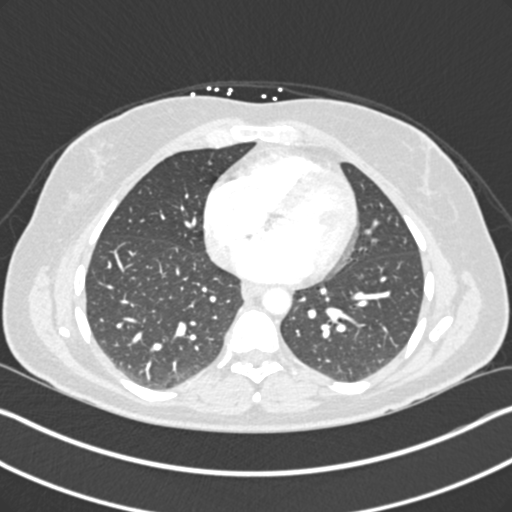
[im 117/270  soft-tissue]
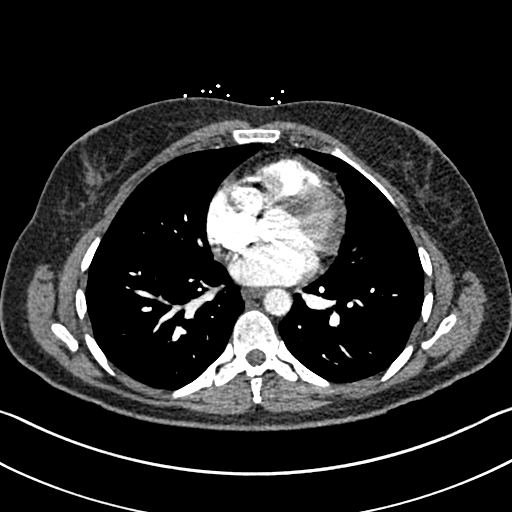
[im 141/270  lung]
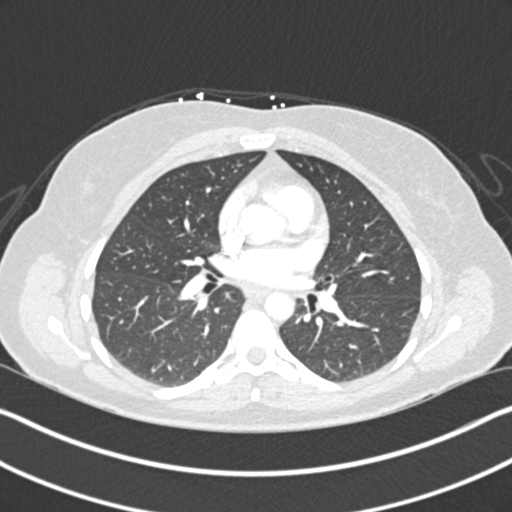
[im 153/270  soft-tissue]
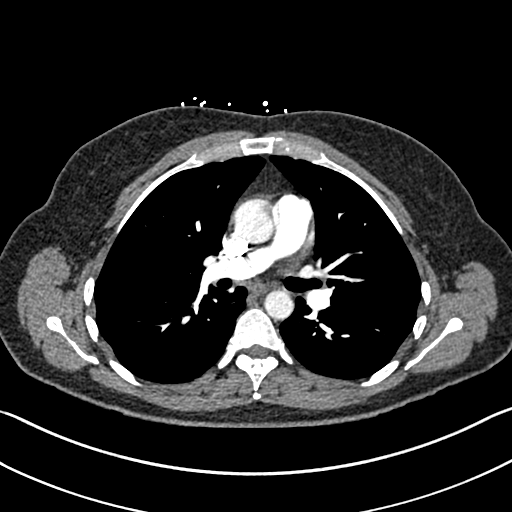
[im 164/270  lung]
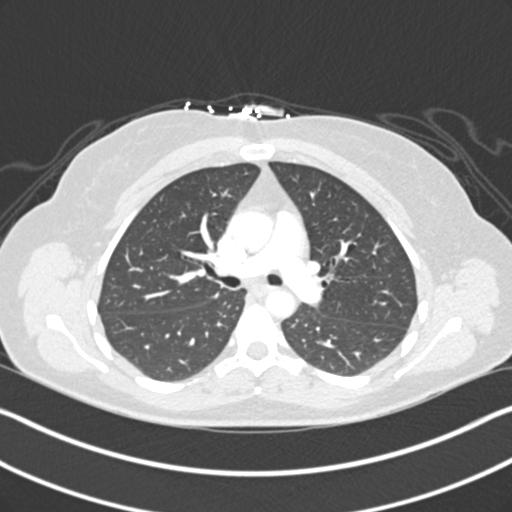
[im 176/270  soft-tissue]
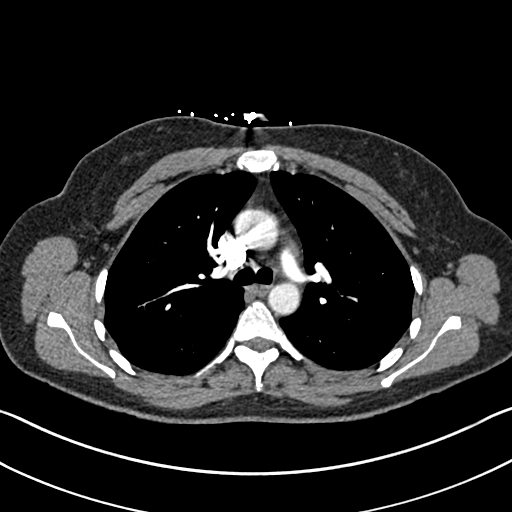
[im 199/270  lung]
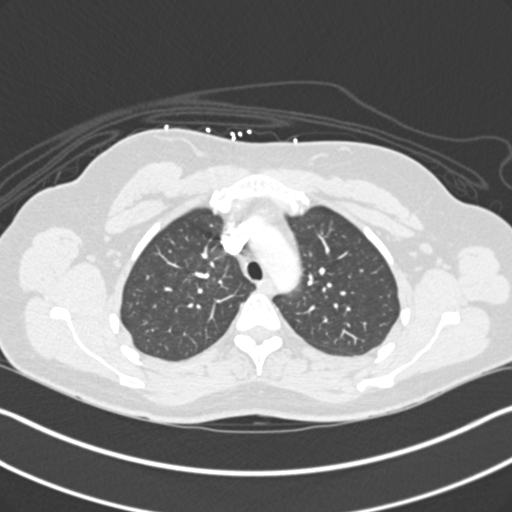
[im 211/270  soft-tissue]
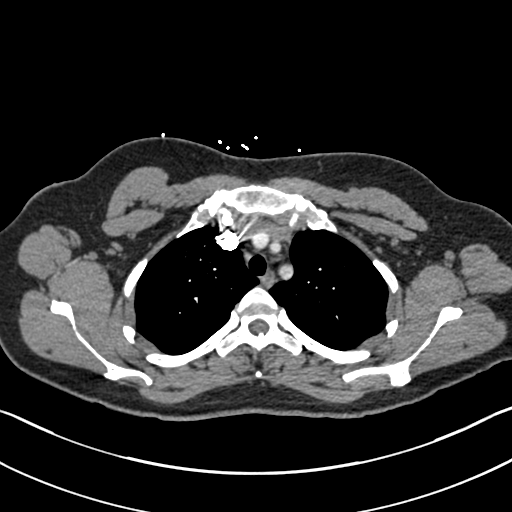
[im 223/270  lung]
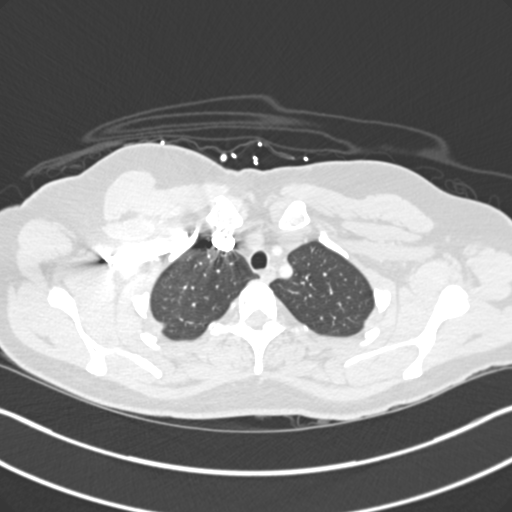
[im 246/270  soft-tissue]
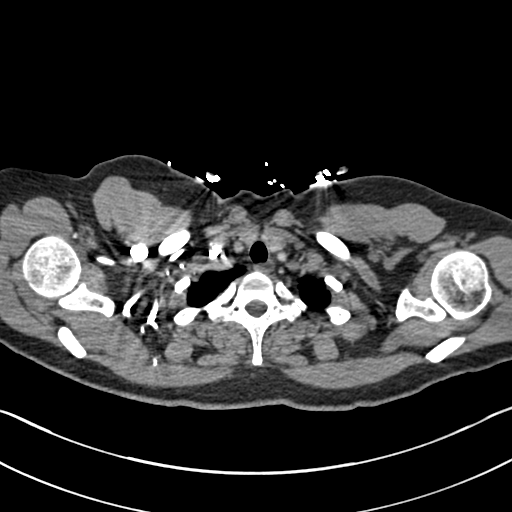
[im 258/270  lung]
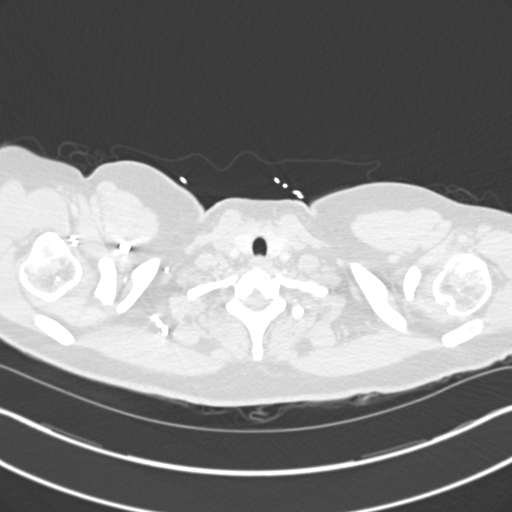

[Series 8: coronal mpr · coronal · 0.58mm/px · 2 of 76 slices shown]
[im 26/76  soft-tissue]
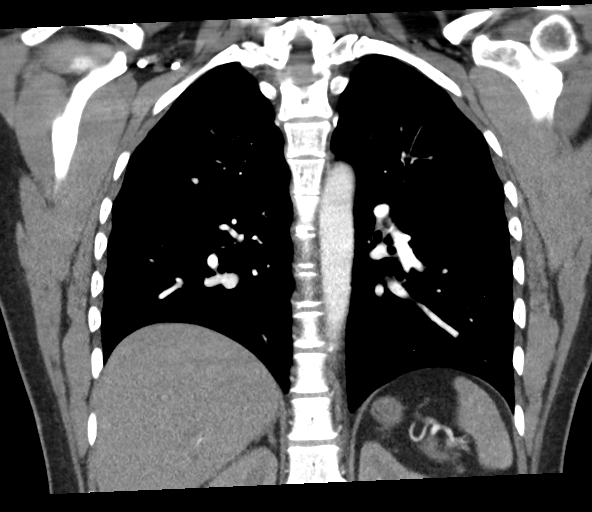
[im 51/76  soft-tissue]
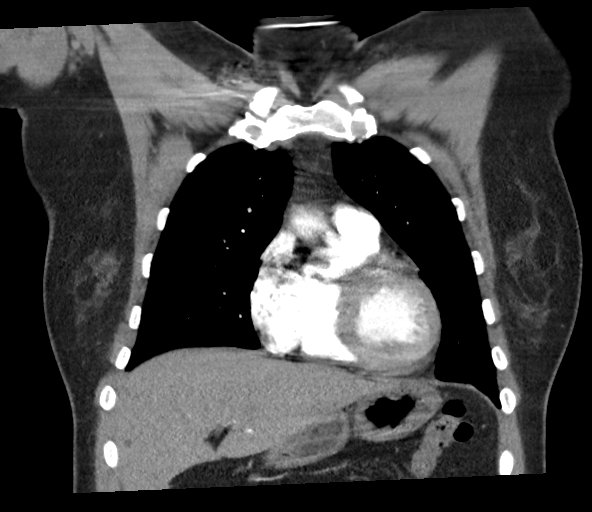

[19 of 46 positions shown; findings below may reference images not displayed]

FINDINGS: Cardiovascular: No filling defects in the pulmonary arteries to
suggest pulmonary emboli. Heart is normal size. Aorta is normal
caliber.

Mediastinum/Nodes: No mediastinal, hilar, or axillary adenopathy.
Trachea and esophagus are unremarkable.

Lungs/Pleura: Lungs are clear. No focal airspace opacities or
suspicious nodules. No effusions.

Upper Abdomen: Imaging into the upper abdomen shows no acute
findings.

Musculoskeletal: Chest wall soft tissues are unremarkable. No acute
bony abnormality.

Review of the MIP images confirms the above findings.
IMPRESSION: No evidence of pulmonary embolus.

No acute cardiopulmonary disease

## 2018-06-22 ENCOUNTER — Encounter: Payer: Self-pay | Admitting: Adult Health

## 2018-06-22 ENCOUNTER — Ambulatory Visit: Payer: Managed Care, Other (non HMO) | Admitting: Adult Health

## 2018-06-22 ENCOUNTER — Other Ambulatory Visit: Payer: Self-pay

## 2018-06-22 VITALS — BP 132/78 | HR 70 | Temp 98.5°F | Ht 66.0 in | Wt 153.0 lb

## 2018-06-22 DIAGNOSIS — I10 Essential (primary) hypertension: Secondary | ICD-10-CM | POA: Insufficient documentation

## 2018-06-22 DIAGNOSIS — Z0189 Encounter for other specified special examinations: Secondary | ICD-10-CM

## 2018-06-22 DIAGNOSIS — Z008 Encounter for other general examination: Secondary | ICD-10-CM

## 2018-06-22 NOTE — Progress Notes (Signed)
Tawny Asal, MD DVT in past.   LMP 09/2016 DVT in august / stopped  Menapause.   [facility]  Stacy Juarez DOB: 48 y.o. MRN: [id number]  Subjective:  Blood pressure 132/78, pulse 70, temperature 98.5 F (36.9 C), temperature source Oral.  Here for Biometric Screen/brief exam  She denies any health concerns at this time.  Tawny Asal, MD  Patient  denies any fever, body aches,chills, rash, chest pain, shortness of breath, nausea, vomiting, or diarrhea.    Objective: Blood pressure 132/78, pulse 70, temperature 98.5 F (36.9 C), temperature source Oral. NAD HEENT: Within normal limits Neck: Normal, supple  Heart: Regular rate and rhythm Lungs: Clear  Patient is alert and oriented and responsive to questions Engages in eye contact with provider. Speaks in full sentences without any pauses without any shortness of breath or distress.   Assessment: Biometric screen   Plan:  Fasting glucose and lipids. Discussed with patient that today's visit here is a limited biometric screening visit (not a comprehensive exam or management of any chronic problems) Discussed some health issues, including healthy eating habits and exercise. Encouraged to follow-up with PCP for annual comprehensive preventive and wellness care (and if applicable, any chronic issues). Questions invited and answered.    Follow up with primary care as needed for chronic and maintenance health care- can be seen in this employee clinic for acute care.    I will have the office call you on your glucose and cholesterol results when they return if you have not heard within 1 week please call the office.  This biometric physical is a brief physical and the only labs done are glucose and your lipid panel(cholesterol) and is  not a substitute for seeing a primary care provider for a complete annual physical. Please see a primary care physician for routine health  maintenance, labs and full physical at least yearly and follow up as recommended by your provider. Provider also recommends if you do not have a primary care provider for patient to establish care as soon as possible .Patient may chose provider of choice. Also gave the [place]  PHYSICIAN/PROVIDER  REFERRAL LINE at [phone] or web site at [email or web address] to help assist with finding a primary care doctor.  Patient verbalizes understanding that his office is acute care only and not a substitute for a primary care or for the management of chronic conditions.

## 2018-06-22 NOTE — Patient Instructions (Signed)
Follow up with primary care as needed for chronic and maintenance health care- can be seen in this employee clinic for acute care.   I will have the office call you on your glucose and cholesterol results when they return if you have not heard within 1 week please call the office.  This biometric physical is a brief physical and the only labs done are glucose and your lipid panel(cholesterol) and is  not a substitute for seeing a primary care provider for a complete annual physical. Please see a primary care physician for routine health maintenance, labs and full physical at least yearly and follow up as recommended by your provider. Provider also recommends if you do not have a primary care provider for patient to establish care as soon as possible .Patient may chose provider of choice. Also gave the [place] at [phone] or web site at [email or web address] to help assist with finding a primary care doctor.  Patient verbalizes understanding that his office is acute care only and not a substitute for a primary care or for the management of chronic conditions.     Health Maintenance, Female Adopting a healthy lifestyle and getting preventive care can go a long way to promote health and wellness. Talk with your health care provider about what schedule of regular examinations is right for you. This is a good chance for you to check in with your provider about disease prevention and staying healthy. In between checkups, there are plenty of things you can do on your own. Experts have done a lot of research about which lifestyle changes and preventive measures are most likely to keep you healthy. Ask your health care provider for more information. Weight and diet Eat a healthy diet  Be sure to include plenty of vegetables, fruits, low-fat dairy products, and lean protein.  Do not eat a lot of foods high in solid fats, added sugars, or salt.  Get regular exercise.  This is one of the most important things you can do for your health. ? Most adults should exercise for at least 150 minutes each week. The exercise should increase your heart rate and make you sweat (moderate-intensity exercise). ? Most adults should also do strengthening exercises at least twice a week. This is in addition to the moderate-intensity exercise. Maintain a healthy weight  Body mass index (BMI) is a measurement that can be used to identify possible weight problems. It estimates body fat based on height and weight. Your health care provider can help determine your BMI and help you achieve or maintain a healthy weight.  For females 47 years of age and older: ? A BMI below 18.5 is considered underweight. ? A BMI of 18.5 to 24.9 is normal. ? A BMI of 25 to 29.9 is considered overweight. ? A BMI of 30 and above is considered obese. Watch levels of cholesterol and blood lipids  You should start having your blood tested for lipids and cholesterol at 50 years of age, then have this test every 5 years.  You may need to have your cholesterol levels checked more often if: ? Your lipid or cholesterol levels are high. ? You are older than 50 years of age. ? You are at high risk for heart disease. Cancer screening Lung Cancer  Lung cancer screening is recommended for adults 50-70 years old who are at high risk for lung cancer because of a history of smoking.  A yearly low-dose CT scan  of the lungs is recommended for people who: ? Currently smoke. ? Have quit within the past 15 years. ? Have at least a 30-pack-year history of smoking. A pack year is smoking an average of one pack of cigarettes a day for 1 year.  Yearly screening should continue until it has been 15 years since you quit.  Yearly screening should stop if you develop a health problem that would prevent you from having lung cancer treatment. Breast Cancer  Practice breast self-awareness. This means understanding how your  breasts normally appear and feel.  It also means doing regular breast self-exams. Let your health care provider know about any changes, no matter how small.  If you are in your 20s or 30s, you should have a clinical breast exam (CBE) by a health care provider every 1-3 years as part of a regular health exam.  If you are 40 or older, have a CBE every year. Also consider having a breast X-ray (mammogram) every year.  If you have a family history of breast cancer, talk to your health care provider about genetic screening.  If you are at high risk for breast cancer, talk to your health care provider about having an MRI and a mammogram every year.  Breast cancer gene (BRCA) assessment is recommended for women who have family members with BRCA-related cancers. BRCA-related cancers include: ? Breast. ? Ovarian. ? Tubal. ? Peritoneal cancers.  Results of the assessment will determine the need for genetic counseling and BRCA1 and BRCA2 testing. Cervical Cancer Your health care provider may recommend that you be screened regularly for cancer of the pelvic organs (ovaries, uterus, and vagina). This screening involves a pelvic examination, including checking for microscopic changes to the surface of your cervix (Pap test). You may be encouraged to have this screening done every 3 years, beginning at age 21.  For women ages 30-65, health care providers may recommend pelvic exams and Pap testing every 3 years, or they may recommend the Pap and pelvic exam, combined with testing for human papilloma virus (HPV), every 5 years. Some types of HPV increase your risk of cervical cancer. Testing for HPV may also be done on women of any age with unclear Pap test results.  Other health care providers may not recommend any screening for nonpregnant women who are considered low risk for pelvic cancer and who do not have symptoms. Ask your health care provider if a screening pelvic exam is right for you.  If you  have had past treatment for cervical cancer or a condition that could lead to cancer, you need Pap tests and screening for cancer for at least 20 years after your treatment. If Pap tests have been discontinued, your risk factors (such as having a new sexual partner) need to be reassessed to determine if screening should resume. Some women have medical problems that increase the chance of getting cervical cancer. In these cases, your health care provider may recommend more frequent screening and Pap tests. Colorectal Cancer  This type of cancer can be detected and often prevented.  Routine colorectal cancer screening usually begins at 50 years of age and continues through 50 years of age.  Your health care provider may recommend screening at an earlier age if you have risk factors for colon cancer.  Your health care provider may also recommend using home test kits to check for hidden blood in the stool.  A small camera at the end of a tube can be used to examine   your colon directly (sigmoidoscopy or colonoscopy). This is done to check for the earliest forms of colorectal cancer.  Routine screening usually begins at age 16.  Direct examination of the colon should be repeated every 5-10 years through 50 years of age. However, you may need to be screened more often if early forms of precancerous polyps or small growths are found. Skin Cancer  Check your skin from head to toe regularly.  Tell your health care provider about any new moles or changes in moles, especially if there is a change in a mole's shape or color.  Also tell your health care provider if you have a mole that is larger than the size of a pencil eraser.  Always use sunscreen. Apply sunscreen liberally and repeatedly throughout the day.  Protect yourself by wearing long sleeves, pants, a wide-brimmed hat, and sunglasses whenever you are outside. Heart disease, diabetes, and high blood pressure  High blood pressure causes heart  disease and increases the risk of stroke. High blood pressure is more likely to develop in: ? People who have blood pressure in the high end of the normal range (130-139/85-89 mm Hg). ? People who are overweight or obese. ? People who are African American.  If you are 48-71 years of age, have your blood pressure checked every 3-5 years. If you are 44 years of age or older, have your blood pressure checked every year. You should have your blood pressure measured twice-once when you are at a hospital or clinic, and once when you are not at a hospital or clinic. Record the average of the two measurements. To check your blood pressure when you are not at a hospital or clinic, you can use: ? An automated blood pressure machine at a pharmacy. ? A home blood pressure monitor.  If you are between 65 years and 40 years old, ask your health care provider if you should take aspirin to prevent strokes.  Have regular diabetes screenings. This involves taking a blood sample to check your fasting blood sugar level. ? If you are at a normal weight and have a low risk for diabetes, have this test once every three years after 50 years of age. ? If you are overweight and have a high risk for diabetes, consider being tested at a younger age or more often. Preventing infection Hepatitis B  If you have a higher risk for hepatitis B, you should be screened for this virus. You are considered at high risk for hepatitis B if: ? You were born in a country where hepatitis B is common. Ask your health care provider which countries are considered high risk. ? Your parents were born in a high-risk country, and you have not been immunized against hepatitis B (hepatitis B vaccine). ? You have HIV or AIDS. ? You use needles to inject street drugs. ? You live with someone who has hepatitis B. ? You have had sex with someone who has hepatitis B. ? You get hemodialysis treatment. ? You take certain medicines for conditions,  including cancer, organ transplantation, and autoimmune conditions. Hepatitis C  Blood testing is recommended for: ? Everyone born from [birth year] through 1967. ? Anyone with known risk factors for hepatitis C. Sexually transmitted infections (STIs)  You should be screened for sexually transmitted infections (STIs) including gonorrhea and chlamydia if: ? You are sexually active and are younger than 50 years of age. ? You are older than 50 years of age and your health care provider tells  you that you are at risk for this type of infection. ? Your sexual activity has changed since you were last screened and you are at an increased risk for chlamydia or gonorrhea. Ask your health care provider if you are at risk.  If you do not have HIV, but are at risk, it may be recommended that you take a prescription medicine daily to prevent HIV infection. This is called pre-exposure prophylaxis (PrEP). You are considered at risk if: ? You are sexually active and do not regularly use condoms or know the HIV status of your partner(s). ? You take drugs by injection. ? You are sexually active with a partner who has HIV. Talk with your health care provider about whether you are at high risk of being infected with HIV. If you choose to begin PrEP, you should first be tested for HIV. You should then be tested every 3 months for as long as you are taking PrEP. Pregnancy  If you are premenopausal and you may become pregnant, ask your health care provider about preconception counseling.  If you may become pregnant, take 400 to 800 micrograms (mcg) of folic acid every day.  If you want to prevent pregnancy, talk to your health care provider about birth control (contraception). Osteoporosis and menopause  Osteoporosis is a disease in which the bones lose minerals and strength with aging. This can result in serious bone fractures. Your risk for osteoporosis can be identified using a bone density scan.  If you are 81  years of age or older, or if you are at risk for osteoporosis and fractures, ask your health care provider if you should be screened.  Ask your health care provider whether you should take a calcium or vitamin D supplement to lower your risk for osteoporosis.  Menopause may have certain physical symptoms and risks.  Hormone replacement therapy may reduce some of these symptoms and risks. Talk to your health care provider about whether hormone replacement therapy is right for you. Follow these instructions at home:  Schedule regular health, dental, and eye exams.  Stay current with your immunizations.  Do not use any tobacco products including cigarettes, chewing tobacco, or electronic cigarettes.  If you are pregnant, do not drink alcohol.  If you are breastfeeding, limit how much and how often you drink alcohol.  Limit alcohol intake to no more than 1 drink per day for nonpregnant women. One drink equals 12 ounces of beer, 5 ounces of wine, or 1 ounces of hard liquor.  Do not use street drugs.  Do not share needles.  Ask your health care provider for help if you need support or information about quitting drugs.  Tell your health care provider if you often feel depressed.  Tell your health care provider if you have ever been abused or do not feel safe at home. This information is not intended to replace advice given to you by your health care provider. Make sure you discuss any questions you have with your health care provider. Document Released: 07/20/2010 Document Revised: 06/12/2015 Document Reviewed: 10/08/2014 [employer] Interactive Patient Education  2019 [employer].

## 2018-06-23 ENCOUNTER — Encounter: Payer: Self-pay | Admitting: Adult Health

## 2018-06-23 LAB — LIPID PANEL WITH LDL/HDL RATIO
Cholesterol, Total: 212 mg/dL — ABNORMAL HIGH (ref 100–199)
HDL: 66 mg/dL (ref >39)
LDL Calculated: 135 mg/dL — ABNORMAL HIGH (ref 0–99)
LDl/HDL Ratio: 2 ratio (ref 0.0–3.2)
Triglycerides: 53 mg/dL (ref 0–149)
VLDL Cholesterol Cal: 11 mg/dL (ref 5–40)

## 2018-06-23 LAB — GLUCOSE, RANDOM: Glucose: 99 mg/dL (ref 65–99)

## 2020-06-27 DIAGNOSIS — Z0000 Encounter for general adult medical examination without abnormal findings: Secondary | ICD-10-CM | POA: Insufficient documentation

## 2020-06-27 DIAGNOSIS — Z008 Encounter for other general examination: Secondary | ICD-10-CM | POA: Insufficient documentation

## 2020-07-01 ENCOUNTER — Other Ambulatory Visit: Payer: Managed Care, Other (non HMO)

## 2020-07-01 VITALS — BP 132/76 | HR 68 | Temp 97.7°F | Resp 16 | Ht 66.0 in | Wt 164.0 lb

## 2020-07-01 DIAGNOSIS — Z008 Encounter for other general examination: Secondary | ICD-10-CM | POA: Diagnosis not present

## 2020-07-02 LAB — LIPID PANEL
Chol/HDL Ratio: 3.3 ratio (ref 0.0–4.4)
Cholesterol, Total: 191 mg/dL (ref 100–199)
HDL: 58 mg/dL (ref >39)
LDL Chol Calc (NIH): 119 mg/dL — ABNORMAL HIGH (ref 0–99)
Triglycerides: 79 mg/dL (ref 0–149)
VLDL Cholesterol Cal: 14 mg/dL (ref 5–40)

## 2020-07-02 LAB — GLUCOSE, RANDOM: Glucose: 95 mg/dL (ref 65–99)
